# Patient Record
Sex: Female | Born: 1980 | Race: White | Hispanic: No | Smoking: Current every day smoker
Health system: Southern US, Community
[De-identification: ages and names within clinical notes are randomized; demographics above are authoritative.]

## PROBLEM LIST (undated history)

## (undated) DIAGNOSIS — Z8711 Personal history of peptic ulcer disease: Secondary | ICD-10-CM

## (undated) DIAGNOSIS — T4145XA Adverse effect of unspecified anesthetic, initial encounter: Secondary | ICD-10-CM

## (undated) DIAGNOSIS — O039 Complete or unspecified spontaneous abortion without complication: Secondary | ICD-10-CM

## (undated) DIAGNOSIS — J45909 Unspecified asthma, uncomplicated: Secondary | ICD-10-CM

## (undated) DIAGNOSIS — F1911 Other psychoactive substance abuse, in remission: Secondary | ICD-10-CM

## (undated) DIAGNOSIS — R0600 Dyspnea, unspecified: Secondary | ICD-10-CM

## (undated) DIAGNOSIS — B1920 Unspecified viral hepatitis C without hepatic coma: Secondary | ICD-10-CM

## (undated) DIAGNOSIS — M7989 Other specified soft tissue disorders: Secondary | ICD-10-CM

## (undated) DIAGNOSIS — Z9289 Personal history of other medical treatment: Secondary | ICD-10-CM

## (undated) DIAGNOSIS — D693 Immune thrombocytopenic purpura: Secondary | ICD-10-CM

## (undated) DIAGNOSIS — Z87442 Personal history of urinary calculi: Secondary | ICD-10-CM

## (undated) DIAGNOSIS — Z9189 Other specified personal risk factors, not elsewhere classified: Secondary | ICD-10-CM

## (undated) DIAGNOSIS — T148XXA Other injury of unspecified body region, initial encounter: Secondary | ICD-10-CM

## (undated) DIAGNOSIS — Z8614 Personal history of Methicillin resistant Staphylococcus aureus infection: Secondary | ICD-10-CM

## (undated) DIAGNOSIS — G43909 Migraine, unspecified, not intractable, without status migrainosus: Secondary | ICD-10-CM

## (undated) DIAGNOSIS — T8859XA Other complications of anesthesia, initial encounter: Secondary | ICD-10-CM

## (undated) DIAGNOSIS — I8390 Asymptomatic varicose veins of unspecified lower extremity: Secondary | ICD-10-CM

## (undated) DIAGNOSIS — Z8719 Personal history of other diseases of the digestive system: Secondary | ICD-10-CM

## (undated) DIAGNOSIS — J189 Pneumonia, unspecified organism: Secondary | ICD-10-CM

## (undated) DIAGNOSIS — K219 Gastro-esophageal reflux disease without esophagitis: Secondary | ICD-10-CM

## (undated) DIAGNOSIS — N200 Calculus of kidney: Secondary | ICD-10-CM

## (undated) DIAGNOSIS — N201 Calculus of ureter: Secondary | ICD-10-CM

## (undated) HISTORY — PX: DILATION AND CURETTAGE OF UTERUS: SHX78

---

## 2004-02-06 HISTORY — PX: SPLENECTOMY, TOTAL: SHX788

## 2012-05-08 HISTORY — PX: TUBAL LIGATION: SHX77

## 2013-01-13 ENCOUNTER — Encounter (HOSPITAL_COMMUNITY): Payer: Self-pay | Admitting: Emergency Medicine

## 2013-01-13 ENCOUNTER — Emergency Department (HOSPITAL_COMMUNITY)
Admission: EM | Admit: 2013-01-13 | Discharge: 2013-01-13 | Disposition: A | Discharge status: Home / Self Care | Payer: Medicaid Other | Attending: Emergency Medicine | Admitting: Emergency Medicine

## 2013-01-13 DIAGNOSIS — L0291 Cutaneous abscess, unspecified: Secondary | ICD-10-CM

## 2013-01-13 DIAGNOSIS — B1920 Unspecified viral hepatitis C without hepatic coma: Secondary | ICD-10-CM | POA: Insufficient documentation

## 2013-01-13 DIAGNOSIS — Z8619 Personal history of other infectious and parasitic diseases: Secondary | ICD-10-CM | POA: Insufficient documentation

## 2013-01-13 DIAGNOSIS — Z87891 Personal history of nicotine dependence: Secondary | ICD-10-CM | POA: Insufficient documentation

## 2013-01-13 DIAGNOSIS — L0231 Cutaneous abscess of buttock: Secondary | ICD-10-CM | POA: Insufficient documentation

## 2013-01-13 HISTORY — DX: Unspecified viral hepatitis C without hepatic coma: B19.20

## 2013-01-13 LAB — GLUCOSE, CAPILLARY: Glucose-Capillary: 102 mg/dL — ABNORMAL HIGH (ref 70–99)

## 2013-01-13 MED ORDER — SULFAMETHOXAZOLE-TRIMETHOPRIM 800-160 MG PO TABS: 1.0000 | ORAL_TABLET | Freq: Two times a day (BID) | ORAL | Status: DC

## 2013-01-13 NOTE — ED Notes (Signed)
Pt with multiple small abscess to arm and back over last 2 months with some drainage

## 2013-01-13 NOTE — ED Provider Notes (Signed)
CSN: [id number]     Arrival date & time 01/13/13  1617 History  This chart was scribed for non-physician practitioner Kaylyn Lim working with Loren Racer, MD by Leone Payor, ED Scribe. This patient was seen in room TR11C/TR11C and the patient's care was started at 1617.    Chief Complaint  Patient presents with  . Abscess   The history is provided by the patient. No language interpreter was used.   HPI Comments: Rachel Mcdonald is a 32 y.o. Female with history of abscess who presents to the Emergency Department complaining of 1-2 days of gradual onset, gradually worsening, constant abscess to the right buttock. Pt has a history of abscesses to the bilateral forearms. She was treated with antibiotics for the previous abscesses at an UC in [place]. Pt denies fevers, chills, nausea, emesis. Pt has family history of diabetes. She denies history of DM. Pt does not have any allergies to medications.   Past Medical History  Diagnosis Date  . Hepatitis C    History reviewed. No pertinent past surgical history. History reviewed. No pertinent family history. History  Substance Use Topics  . Smoking status: Former Games developer  . Smokeless tobacco: Not on file  . Alcohol Use: No   OB History   Grav Para Term Preterm Abortions TAB SAB Ect Mult Living                 Review of Systems A complete 10 system review of systems was obtained and all systems are negative except as noted in the HPI and PMH.   Allergies  Review of patient's allergies indicates no known allergies.  Home Medications  No current outpatient prescriptions on file. Triage Vitals:BP 130/80  Pulse 76  Temp(Src) 98.4 F (36.9 C) (Oral)  Resp 18  SpO2 96%  Physical Exam  Nursing note and vitals reviewed. Constitutional: She is oriented to person, place, and time. She appears well-developed and well-nourished. No distress.  HENT:  Head: Normocephalic.  Eyes: Conjunctivae and EOM are normal.  Cardiovascular:  Normal rate.   Pulmonary/Chest: Effort normal and breath sounds normal. No stridor.  Musculoskeletal: Normal range of motion.  Neurological: She is alert and oriented to person, place, and time.  Skin:  Half centimeter, indurated abscess 2 low back. No fluctuance, no drainage  Psychiatric: She has a normal mood and affect.    ED Course  Procedures   DIAGNOSTIC STUDIES: Oxygen Saturation is 96% on RA, normal by my interpretation.    COORDINATION OF CARE: 5:55 PM Will prescribe antibiotics and recommended to use warm compresses. Discussed treatment plan with pt at bedside and pt agreed to plan.  Labs Review Labs Reviewed - No data to display Imaging Review No results found.  MDM   1. Abscess    Filed Vitals:   01/13/13 1622  BP: 130/80  Pulse: 76  Temp: 98.4 F (36.9 C)  TempSrc: Oral  Resp: 18  SpO2: 96%     Rachel Mcdonald is a 32 y.o. female with recurrent abscesses over the last few months. Blood sugar within normal limits. Abscess today is small and indurated I doubt incision and drainage would yield any personal material. I will start her on antibiotics and advised her to perform warm compresses and to return to the ED if it becomes fluctuant or if she has any systemic signs of infection.  Pt is hemodynamically stable, appropriate for, and amenable to discharge at this time. Pt verbalized understanding and agrees with care plan.  All questions answered. Outpatient follow-up and specific return precautions discussed.    Discharge Medication List as of 01/13/2013  5:54 PM    START taking these medications   Details  sulfamethoxazole-trimethoprim (SEPTRA DS) 800-160 MG per tablet Take 1 tablet by mouth every 12 (twelve) hours., Starting 01/13/2013, Until Discontinued, Print        I personally performed the services described in this documentation, which was scribed in my presence. The recorded information has been reviewed and is accurate.  Note: Portions of this  report may have been transcribed using voice recognition software. Every effort was made to ensure accuracy; however, inadvertent computerized transcription errors may be present      Wynetta Emery, PA-C 01/16/13 0106

## 2013-01-16 NOTE — ED Provider Notes (Signed)
Medical screening examination/treatment/procedure(s) were performed by non-physician practitioner and as supervising physician I was immediately available for consultation/collaboration.   Achillies Buehl, MD 01/16/13 1539 

## 2013-01-30 ENCOUNTER — Encounter (HOSPITAL_COMMUNITY): Payer: Self-pay | Admitting: Emergency Medicine

## 2013-01-30 ENCOUNTER — Emergency Department (HOSPITAL_COMMUNITY)
Admission: EM | Admit: 2013-01-30 | Discharge: 2013-01-30 | Disposition: A | Discharge status: Home / Self Care | Payer: Medicaid Other | Attending: Emergency Medicine | Admitting: Emergency Medicine

## 2013-01-30 DIAGNOSIS — L988 Other specified disorders of the skin and subcutaneous tissue: Secondary | ICD-10-CM | POA: Insufficient documentation

## 2013-01-30 DIAGNOSIS — Z8619 Personal history of other infectious and parasitic diseases: Secondary | ICD-10-CM | POA: Insufficient documentation

## 2013-01-30 DIAGNOSIS — R238 Other skin changes: Secondary | ICD-10-CM

## 2013-01-30 DIAGNOSIS — Z87891 Personal history of nicotine dependence: Secondary | ICD-10-CM | POA: Insufficient documentation

## 2013-01-30 NOTE — ED Notes (Signed)
Pt c/o reddened spot under right arm, tender to touch. States she thinks it is an abscess,  Because all her other ones started out like that.

## 2013-01-30 NOTE — ED Provider Notes (Signed)
CSN: [id number]     Arrival date & time 01/30/13  4742 History   First MD Initiated Contact with Patient 01/30/13 [phone]     Chief Complaint  Patient presents with  . Abscess   (Consider location/radiation/quality/duration/timing/severity/associated sxs/prior Treatment) Patient is a 32 y.o. female presenting with abscess. The history is provided by the patient.  Abscess Location:  Shoulder/arm Shoulder/arm abscess location:  R axilla Size:  <1cm Abscess quality: painful and redness   Abscess quality: not draining, no fluctuance, no induration and no itching   Red streaking: no   Duration:  2 days Progression:  Worsening Pain details:    Quality:  Aching   Severity:  Mild   Timing:  Constant   Progression:  Worsening Chronicity:  Recurrent Context: not diabetes, not immunosuppression, not insect bite/sting and not skin injury   Associated symptoms: no fever     Past Medical History  Diagnosis Date  . Hepatitis C    History reviewed. No pertinent past surgical history. History reviewed. No pertinent family history. History  Substance Use Topics  . Smoking status: Former Games developer  . Smokeless tobacco: Not on file  . Alcohol Use: No   OB History   Grav Para Term Preterm Abortions TAB SAB Ect Mult Living                 Review of Systems  Constitutional: Negative for fever.  Respiratory: Negative for cough and shortness of breath.   All other systems reviewed and are negative.    Allergies  Review of patient's allergies indicates no known allergies.  Home Medications   Current Outpatient Rx  Name  Route  Sig  Dispense  Refill  . sulfamethoxazole-trimethoprim (SEPTRA DS) 800-160 MG per tablet   Oral   Take 1 tablet by mouth every 12 (twelve) hours.   20 tablet   0    BP 106/50  Pulse 86  Temp(Src) 98.4 F (36.9 C) (Oral)  Resp 20  SpO2 98% Physical Exam  Nursing note and vitals reviewed. Constitutional: She is oriented to person, place, and time. She  appears well-developed and well-nourished. No distress.  HENT:  Head: Normocephalic and atraumatic.  Eyes: EOM are normal. Pupils are equal, round, and reactive to light.  Neck: Normal range of motion. Neck supple.  Cardiovascular: Normal rate and regular rhythm.  Exam reveals no friction rub.   No murmur heard. Pulmonary/Chest: Effort normal and breath sounds normal. No respiratory distress. She has no wheezes. She has no rales.  Abdominal: Soft. She exhibits no distension. There is no tenderness. There is no rebound.  Musculoskeletal: Normal range of motion. She exhibits no edema.  Neurological: She is alert and oriented to person, place, and time.  Skin: She is not diaphoretic.  Small pimple in R axilla. Tender to palpation. No surrounding cellulitis or erythema.    ED Course  Procedures (including critical care time) Labs Review Labs Reviewed - No data to display Imaging Review No results found.  MDM   1. Pimples    60F with hx of recurrent abscess presents with concern for developing abscess in R axilla. Denies systemic symptoms of fever, N/V/D, abdominal pain. Hx of I&D multiple times since birth of her son 3 months ago. She is not breast feeding. AFVSS here. The concern today is a pimple/ingrown hair in her R axilla. No large abscess, no cellulitis. I offered expression of pus from the pimple, but she refused. I recommended warm compresses and attempts at drainage.  With recurrent skin abscess, I recommended dilute bleach baths to help with decolonization of her skin and gave her instructions for these in her discharge instructions. I explained to her antibiotics at this time will not help and may only hurt without any cellulitis. She wants to attempted gentle expression of the pus on her own and doesn't want an I&D here. Patient stable for discharge, given the resource guide for f/u as she just moved here. Gave her strict return precautions in the event this worsens.    Dagmar Hait, MD 01/30/13 [phone]

## 2013-03-05 ENCOUNTER — Emergency Department (HOSPITAL_COMMUNITY)
Admission: EM | Admit: 2013-03-05 | Discharge: 2013-03-05 | Disposition: A | Discharge status: Home / Self Care | Payer: Medicaid Other | Attending: Emergency Medicine | Admitting: Emergency Medicine

## 2013-03-05 ENCOUNTER — Encounter (HOSPITAL_COMMUNITY): Payer: Self-pay | Admitting: Emergency Medicine

## 2013-03-05 DIAGNOSIS — L089 Local infection of the skin and subcutaneous tissue, unspecified: Secondary | ICD-10-CM | POA: Insufficient documentation

## 2013-03-05 DIAGNOSIS — Z87891 Personal history of nicotine dependence: Secondary | ICD-10-CM | POA: Insufficient documentation

## 2013-03-05 DIAGNOSIS — Z8619 Personal history of other infectious and parasitic diseases: Secondary | ICD-10-CM | POA: Insufficient documentation

## 2013-03-05 MED ORDER — CLINDAMYCIN HCL 300 MG PO CAPS
300.0000 mg | ORAL_CAPSULE | Freq: Once | ORAL | Status: AC
  Administered 2013-03-05: 300 mg via ORAL
  Filled 2013-03-05: qty 1

## 2013-03-05 MED ORDER — CLINDAMYCIN HCL 150 MG PO CAPS: 300.0000 mg | ORAL_CAPSULE | Freq: Three times a day (TID) | ORAL | Status: DC

## 2013-03-05 NOTE — ED Notes (Signed)
Pt complains of recurrent skin infections, has been told that its MRSA

## 2013-03-05 NOTE — ED Provider Notes (Signed)
CSN: [id number]     Arrival date & time 03/05/13  2111 History   First MD Initiated Contact with Patient 03/05/13 2143     Chief Complaint  Patient presents with  . Recurrent Skin Infections   (Consider location/radiation/quality/duration/timing/severity/associated sxs/prior Treatment) HPI Comments: Patient is a 32 year old female with a past medical history of hepatitis C who presents complaining of recurrent skin infections. Patient reports symptoms started about 3 months ago gradually and progressively worsened. Patient reports initially having a localized infection of her forearm. She was told it was a spider bite and noticed it had spread to the other arm. Patient was treated with antibiotics. Patient had the infection spread to her face 1 month ago and was prescribed Bactrim in the ED. Patient reports some relief initially but now worsening of symptoms. The affected areas are painful and tender to palpation. Patient denies anyone around her with the same symptoms.    Past Medical History  Diagnosis Date  . Hepatitis C    History reviewed. No pertinent past surgical history. History reviewed. No pertinent family history. History  Substance Use Topics  . Smoking status: Former Games developer  . Smokeless tobacco: Not on file  . Alcohol Use: No   OB History   Grav Para Term Preterm Abortions TAB SAB Ect Mult Living                 Review of Systems  Skin: Positive for wound.  All other systems reviewed and are negative.    Allergies  Review of patient's allergies indicates no known allergies.  Home Medications   Current Outpatient Rx  Name  Route  Sig  Dispense  Refill  . Aspirin-Acetaminophen-Caffeine (GOODYS EXTRA STRENGTH) 260-130-16 MG TABS   Oral   Take 1 tablet by mouth 2 (two) times daily as needed (pain).         Marland Kitchen ibuprofen (ADVIL,MOTRIN) 200 MG tablet   Oral   Take 800 mg by mouth every 6 (six) hours as needed for pain (pain).          BP 118/80  Pulse 87   Temp(Src) 98.2 F (36.8 C) (Oral)  Resp 18  Ht 5\' 4"  (1.626 m)  Wt 275 lb (124.739 kg)  BMI 47.18 kg/m2  LMP 02/12/2013 Physical Exam  Nursing note and vitals reviewed. Constitutional: She is oriented to person, place, and time. She appears well-developed and well-nourished. No distress.  HENT:  Head: Normocephalic and atraumatic.  See skin.  Eyes: Conjunctivae are normal.  Neck: Normal range of motion.  Cardiovascular: Normal rate and regular rhythm.  Exam reveals no gallop and no friction rub.   No murmur heard. Pulmonary/Chest: Effort normal and breath sounds normal. She has no wheezes. She has no rales. She exhibits no tenderness.  Musculoskeletal: Normal range of motion.  Neurological: She is alert and oriented to person, place, and time. Coordination normal.  Speech is goal-oriented. Moves limbs without ataxia.   Skin: Skin is warm and dry.  Pea to dime sized areas of induration located on patient's face. No drainage noted. The areas are tender to palpate.   Psychiatric: She has a normal mood and affect. Her behavior is normal.    ED Course  Procedures (including critical care time) Labs Review Labs Reviewed - No data to display Imaging Review No results found.  EKG Interpretation   None       MDM   1. Skin infection     9:57 PM Patient will  have Clindamycin and instructions to follow up with a Dermatologist. Vitals stable and patient afebrile.    Emilia Beck, PA-C 03/05/13 2228

## 2013-03-05 NOTE — ED Provider Notes (Signed)
Medical screening examination/treatment/procedure(s) were performed by non-physician practitioner and as supervising physician I was immediately available for consultation/collaboration.  EKG Interpretation   None         Richardean Canal, MD 03/05/13 2258

## 2013-12-01 ENCOUNTER — Emergency Department (HOSPITAL_COMMUNITY)
Admission: EM | Admit: 2013-12-01 | Discharge: 2013-12-01 | Discharge status: Against Medical Advice | Payer: Medicaid Other

## 2014-04-28 ENCOUNTER — Emergency Department (HOSPITAL_COMMUNITY): Payer: Medicaid Other

## 2014-04-28 ENCOUNTER — Emergency Department (HOSPITAL_COMMUNITY)
Admission: EM | Admit: 2014-04-28 | Discharge: 2014-04-28 | Disposition: A | Discharge status: Home / Self Care | Payer: Medicaid Other | Attending: Emergency Medicine | Admitting: Emergency Medicine

## 2014-04-28 ENCOUNTER — Encounter (HOSPITAL_COMMUNITY): Payer: Self-pay | Admitting: Emergency Medicine

## 2014-04-28 DIAGNOSIS — J45901 Unspecified asthma with (acute) exacerbation: Secondary | ICD-10-CM | POA: Insufficient documentation

## 2014-04-28 DIAGNOSIS — J40 Bronchitis, not specified as acute or chronic: Secondary | ICD-10-CM

## 2014-04-28 DIAGNOSIS — Z720 Tobacco use: Secondary | ICD-10-CM | POA: Insufficient documentation

## 2014-04-28 DIAGNOSIS — F17200 Nicotine dependence, unspecified, uncomplicated: Secondary | ICD-10-CM

## 2014-04-28 DIAGNOSIS — Z8619 Personal history of other infectious and parasitic diseases: Secondary | ICD-10-CM | POA: Insufficient documentation

## 2014-04-28 DIAGNOSIS — Z792 Long term (current) use of antibiotics: Secondary | ICD-10-CM | POA: Insufficient documentation

## 2014-04-28 DIAGNOSIS — Z8709 Personal history of other diseases of the respiratory system: Secondary | ICD-10-CM

## 2014-04-28 MED ORDER — PREDNISONE 20 MG PO TABS
60.0000 mg | ORAL_TABLET | Freq: Once | ORAL | Status: AC
  Administered 2014-04-28: 60 mg via ORAL
  Filled 2014-04-28: qty 3

## 2014-04-28 MED ORDER — PREDNISONE 20 MG PO TABS: ORAL_TABLET | ORAL | Status: DC

## 2014-04-28 MED ORDER — ALBUTEROL SULFATE HFA 108 (90 BASE) MCG/ACT IN AERS
2.0000 | INHALATION_SPRAY | RESPIRATORY_TRACT | Status: DC | PRN
  Administered 2014-04-28: 2 via RESPIRATORY_TRACT
  Filled 2014-04-28: qty 6.7

## 2014-04-28 MED ORDER — AZITHROMYCIN 250 MG PO TABS: 250.0000 mg | ORAL_TABLET | Freq: Every day | ORAL | Status: DC

## 2014-04-28 MED ORDER — IPRATROPIUM-ALBUTEROL 0.5-2.5 (3) MG/3ML IN SOLN
3.0000 mL | Freq: Once | RESPIRATORY_TRACT | Status: AC
Start: 2014-04-28 — End: 2014-04-28
  Administered 2014-04-28: 3 mL via RESPIRATORY_TRACT
  Filled 2014-04-28: qty 3

## 2014-04-28 NOTE — ED Notes (Signed)
Pt reports she began to feel unwell Sunday afternoon and progressively began to have nasal drainage, sore throat and cough. Pt reports productive cough with green sputum.

## 2014-04-28 NOTE — ED Provider Notes (Signed)
CSN: [id number]     Arrival date & time 04/28/14  0920 History   First MD Initiated Contact with Patient 04/28/14 [phone]     Chief Complaint  Patient presents with  . Cough     (Consider location/radiation/quality/duration/timing/severity/associated sxs/prior Treatment) HPI Pt is a 33yo female with hx of asthma, presenting to ED with c/o gradually worsening productive cough that started Sunday, 04/26/14, associated with generalized fatigue, nasal drainage, sore throat and cough.  Reports cough produces thick green sputum. Pt does report being a current cigarette smoker after having stopped for 9months.  She has tried OTC Mucinex and Zicam with minimal relief. States her infant son was sick last week with GI symptoms but denies fever, chills, n/v/d. Denies chest pain. Denies recent travel.    Past Medical History  Diagnosis Date  . Hepatitis C    History reviewed. No pertinent past surgical history. History reviewed. No pertinent family history. History  Substance Use Topics  . Smoking status: Former Games developermoker  . Smokeless tobacco: Not on file  . Alcohol Use: No   OB History    No data available     Review of Systems  Constitutional: Positive for fatigue. Negative for fever, chills and appetite change.  HENT: Positive for congestion, rhinorrhea and sore throat. Negative for trouble swallowing and voice change.   Respiratory: Positive for cough and shortness of breath.   Cardiovascular: Negative for chest pain and palpitations.  Gastrointestinal: Negative for nausea, vomiting, abdominal pain and diarrhea.  All other systems reviewed and are negative.     Allergies  Review of patient's allergies indicates no known allergies.  Home Medications   Prior to Admission medications   Medication Sig Start Date End Date Taking? Authorizing Provider  Aspirin-Acetaminophen-Caffeine (GOODYS EXTRA STRENGTH) 260-130-16 MG TABS Take 1 tablet by mouth 2 (two) times daily as needed (pain).    Yes Historical Provider, MD  dextromethorphan-guaiFENesin (MUCINEX DM) 30-600 MG per 12 hr tablet Take 1 tablet by mouth 2 (two) times daily as needed for cough.   Yes Historical Provider, MD  Homeopathic Products [facility](ZICAM COLD REMEDY) TBDP Take 1 tablet by mouth 3 (three) times daily as needed (for cold).   Yes Historical Provider, MD  azithromycin (ZITHROMAX) 250 MG tablet Take 1 tablet (250 mg total) by mouth daily. Take first 2 tablets together, then 1 every day until finished. 04/28/14   Junius FinnerErin O'Malley, PA-C  clindamycin (CLEOCIN) 150 MG capsule Take 2 capsules (300 mg total) by mouth 3 (three) times daily. May dispense as 150mg  capsules Patient not taking: Reported on 04/28/2014 03/05/13   Emilia BeckKaitlyn Szekalski, PA-C  predniSONE (DELTASONE) 20 MG tablet 3 tabs po day one, then 2 po daily x 4 days 04/28/14   Junius FinnerErin O'Malley, PA-C   BP 118/81 mmHg  Pulse 87  Temp(Src) 98.6 F (37 C) (Oral)  Resp 16  SpO2 98%  LMP 04/14/2014 Physical Exam  Constitutional: She appears well-developed and well-nourished. No distress.  HENT:  Head: Normocephalic and atraumatic.  Right Ear: Hearing, tympanic membrane, external ear and ear canal normal.  Left Ear: Hearing, tympanic membrane, external ear and ear canal normal.  Nose: Mucosal edema and rhinorrhea present. Right sinus exhibits no maxillary sinus tenderness and no frontal sinus tenderness. Left sinus exhibits no maxillary sinus tenderness and no frontal sinus tenderness.  Mouth/Throat: Uvula is midline, oropharynx is clear and moist and mucous membranes are normal.  Eyes: Conjunctivae are normal. No scleral icterus.  Neck: Normal range of motion.  Cardiovascular:  Normal rate, regular rhythm and normal heart sounds.   Pulmonary/Chest: Effort normal. No respiratory distress. She has decreased breath sounds in the right lower field and the left lower field. She has wheezes ( inspiratory and expiratory throughout) in the right upper field, the right middle  field, the right lower field, the left upper field, the left middle field and the left lower field. She has no rales. She exhibits no tenderness.  Abdominal: Soft. Bowel sounds are normal. She exhibits no distension and no mass. There is no tenderness. There is no rebound and no guarding.  Musculoskeletal: Normal range of motion.  Neurological: She is alert.  Skin: Skin is warm and dry. She is not diaphoretic.  Nursing note and vitals reviewed.   ED Course  Procedures (including critical care time) Labs Review Labs Reviewed - No data to display  Imaging Review Dg Chest 2 View  04/28/2014   CLINICAL DATA:  Cough and difficulty breathing ; 2 day history of midportion chest discomfort  EXAM: CHEST  2 VIEW  COMPARISON:  None.  FINDINGS: There is no edema or consolidation. Heart is upper normal in size with pulmonary vascularity within normal limits. No adenopathy. No pneumothorax. 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 Bony structures appear intact.  IMPRESSION: Pectus carinatum.  No edema or consolidation.   Electronically Signed   By: Bretta BangWilliam  Woodruff M.D.   On: 04/28/2014 10:05     EKG Interpretation None      MDM   Final diagnoses:  Bronchitis  History of asthma  Current smoker    Pt is a current smoker, c/o cough and congestion that started Sunday 12/20 associated with sore throat, rhinorrhea and fatigue. Denies fever, n/v/d. Denies chest pain.  Doubt ACS or PE.  Concern for CAP.  On exam, Lungs: diffuse inspiratory and expiratory wheeze. Pt given PO prednisone and duoneb. CXR: pectus carinatum. No edema or consolidation.   After duoneb tx, pt states she feels moderately better.  Lung sounds improved, faint expiratory wheeze in lower lung field remains. Will tx pt with azithromycin due to her productive cough, wheeze, hx of smoking and asthma.  Albuterol inahler provided in ED. Rx: azithromycin, prednisone. Pt also requested help for smoking cessation. Discussed resources with pt as well as  provided pt education tips on smoking cessation. Home care instructions provided. Return precautions provided. Pt verbalized understanding and agreement with tx plan.    Junius Finnerrin O'Malley, PA-C 04/28/14 1144  Kristen N Ward, DO 04/28/14 1529

## 2014-05-22 ENCOUNTER — Emergency Department (HOSPITAL_COMMUNITY)
Admission: EM | Admit: 2014-05-22 | Discharge: 2014-05-22 | Disposition: A | Discharge status: Home / Self Care | Payer: Medicaid Other | Attending: Emergency Medicine | Admitting: Emergency Medicine

## 2014-05-22 ENCOUNTER — Encounter (HOSPITAL_COMMUNITY): Payer: Self-pay | Admitting: Emergency Medicine

## 2014-05-22 DIAGNOSIS — Z79899 Other long term (current) drug therapy: Secondary | ICD-10-CM | POA: Insufficient documentation

## 2014-05-22 DIAGNOSIS — Z7952 Long term (current) use of systemic steroids: Secondary | ICD-10-CM | POA: Insufficient documentation

## 2014-05-22 DIAGNOSIS — Z792 Long term (current) use of antibiotics: Secondary | ICD-10-CM | POA: Insufficient documentation

## 2014-05-22 DIAGNOSIS — Z720 Tobacco use: Secondary | ICD-10-CM | POA: Insufficient documentation

## 2014-05-22 DIAGNOSIS — R519 Headache, unspecified: Secondary | ICD-10-CM

## 2014-05-22 DIAGNOSIS — R51 Headache: Secondary | ICD-10-CM

## 2014-05-22 DIAGNOSIS — G43909 Migraine, unspecified, not intractable, without status migrainosus: Secondary | ICD-10-CM | POA: Insufficient documentation

## 2014-05-22 DIAGNOSIS — Z8619 Personal history of other infectious and parasitic diseases: Secondary | ICD-10-CM | POA: Insufficient documentation

## 2014-05-22 DIAGNOSIS — E669 Obesity, unspecified: Secondary | ICD-10-CM | POA: Insufficient documentation

## 2014-05-22 HISTORY — DX: Migraine, unspecified, not intractable, without status migrainosus: G43.909

## 2014-05-22 MED ORDER — METOCLOPRAMIDE HCL 5 MG/ML IJ SOLN
10.0000 mg | Freq: Once | INTRAMUSCULAR | Status: AC
  Administered 2014-05-22: 10 mg via INTRAMUSCULAR
  Filled 2014-05-22: qty 2

## 2014-05-22 MED ORDER — DIPHENHYDRAMINE HCL 50 MG/ML IJ SOLN
25.0000 mg | Freq: Once | INTRAMUSCULAR | Status: AC
  Administered 2014-05-22: 25 mg via INTRAMUSCULAR
  Filled 2014-05-22: qty 1

## 2014-05-22 MED ORDER — DEXAMETHASONE SODIUM PHOSPHATE 10 MG/ML IJ SOLN
10.0000 mg | Freq: Once | INTRAMUSCULAR | Status: AC
  Administered 2014-05-22: 10 mg via INTRAMUSCULAR
  Filled 2014-05-22: qty 1

## 2014-05-22 NOTE — Discharge Instructions (Signed)
It is important for you to follow up with [facility] neurolog[facility] for further evaluation and management of your headaches. Return to ED for worsening symptoms.   Emergency Department Resource Guide 1) Find a Doctor and Pay Out of Pocket Although you won't have to find out who is covered by your insurance plan, it is a good idea to ask around and get recommendations. You will then need to call the office and see if the doctor you have chosen will accept you as a new patient and what types of options they offer for patients who are self-pay. Some doctors offer discounts or will set up payment plans for their patients who do not have insurance, but you will need to ask so you aren't surprised when you get to your appointment.  2) Contact Your Local Health Department Not all health departments have doctors that can see patients for sick visits, but many do, so it is worth a call to see if yours does. If you don't know where your local health department is, you can check in your phone book. The CDC also has a tool to help you locate your state's health department, and many state websites also have listings of all of their local health departments.  3) Find a Walk-in Clinic If your illness is not likely to be very severe or complicated, you may want to try a walk in clinic. These are popping up all over the country in pharmacies, drugstores, and shopping centers. They're usually staffed by nurse practitioners or physician assistants that have been trained to treat common illnesses and complaints. They're usually fairly quick and inexpensive. However, if you have serious medical issues or chronic medical problems, these are probably not your best option.  No Primary Care Doctor: - Call Health Connect at  [phone] - they can help you locate a primary care doctor that  accepts your insurance, provides certain services, etc. - Physician Referral Service- [phone]  Chronic Pain Problems: Organization          Address  Phone   Notes  [facility]  [phone] Patients need to be referred by their primary care doctor.   Medication Assistance: Organization         Address  Phone   Notes  [facility] [address] [place], [place] [address] [phone] --Must be a resident of [place] -- Must have NO insurance coverage whatsoever (no Medicaid/ Medicare, etc.) -- The pt. MUST have a primary care doctor that directs their care regularly and follows them in the community   MedAssist  [phone]   [employer]  [phone]    Agencies that provide inexpensive medical care: Organization         Address  Phone   Notes  [facility]  [phone]   [facility]    [phone]   [facility] [address] [place], [place] [address] [phone]   [facility] [address], [place] [phone]   [facility]    [phone]   [facility]    [phone]   [facility]  [address], [place] Phone:  [phone], Fax:  [phone] Hours of Operation:  9 am - 6 pm, M-F.  Also accepts Medicaid/Medicare and self-pay.  [facility]  [address], [place]  Phone: [phone], Fax: [phone]. Hours of Operation:  8:30 am - 5:30 pm, M-F.  Also accepts Medicaid and self-pay.  °[facility] [place] [address], [place] Phone: [phone]) [phone]   °[facility] [address], [place], [place] [phone], Ext. 123 Mondays & Thursdays: 7-9 AM.  First 15 patients are seen on a first come, first serve basis. °  ° °Medicaid-accepting [place] Providers: ° °Organization         Address  Phone   Notes  °[facility] [address], [place] [phone] Also accepts self-pay patients.  °[facility] [address], [place] ° ([phone]) [phone]   °[facility] [address], [place] [phone]) [phone]   °[facility] [address] [place] [address], [place] ([phone]) [phone]   °Veita Bland [address], [place]  ° [phone]) [phone] Only accepts [place] Access Medicaid patients after they have their name applied to their card.  ° °Self-Pay (no insurance) in [place]: ° °Organization         Address  Phone   Notes  °Sickle Cell Patients, [facility] [address], [place] [phone]) [phone]   °[facility] [address], [place] ([phone]) [phone]   °[facility] ° [address], [place] [phone]) [phone]   °[facility]/Dr. Osei-Bonsu ° [address] [place] [address], [place] or [address], [place] [phone]) [phone] Phone number for both [place] and [place] locations is the same.  °[facility] [address], [place] ([phone]) [phone]   °[facility] [address] [place] [address], [place] or [address] [phone]) [phone] °(336) [phone]   °[facility] [address], [place] ([phone]) [phone], phone; [phone], fax Sees patients 1st and 3rd Saturday of every month.  Must not qualify for public or private insurance (i.e. Medicaid, Medicare, [place] Health Choice, Veterans' Benefits) • Household income should be no more than 200% of the poverty level •The clinic cannot treat you if you are pregnant or think you are pregnant • Sexually transmitted diseases are not treated at the clinic.  ° ° °Dental Care: °Organization         Address  Phone  Notes  °[facility] [facility] [address], [place] ([phone]) [phone] Accepts children up to age 21 who are enrolled in Medicaid or [place] Health Choice; pregnant women with a Medicaid card; and  children who have applied for Medicaid or [place] Health Choice, but were declined, whose parents can pay a reduced fee at time of service.  °[place] [facility] [place]  [address], [place] (336) [phone] Accepts children up to age 21 who are enrolled in Medicaid or [place] Health Choice; pregnant women with a Medicaid card; and children who have applied for Medicaid or  Health Choice, but were declined, whose parents can pay a reduced fee at time of service.  °[facility] ° [address],  ([phone]) [phone] Patients are seen by appointment only. Walk-ins are not accepted. [facility] will see patients 18 years of age and older. °Monday - Tuesday (8am-5pm) °Most Wednesdays (8:30-5pm) °$30 per visit, cash only  °[facility] ° [address], [facility] [phone] Patients are seen by appointment only. Walk-ins are not accepted. [place] will see patients 23 years of age and older. One Wednesday Evening (Monthly: Volunteer Based).  $30 per visit, cash only  [place]  [phone] for adults; Children under age 68, call Graduate Pediatric Dentistry at [phone]. Children aged 23-14, please call [phone] to request a pediatric application.  Dental services are provided in all areas of dental care including fillings, crowns and bridges, complete and partial dentures, implants, gum treatment, root canals, and extractions. Preventive care is also provided. Treatment is provided to both adults and children. Patients are selected via a lottery and there is often a waiting list.   [facility] [address] [place]  [phone] [email or web address]   [facility] [address] [place], [place] [phone], Ext. [phone] Second and Fourth Thursday of each month, opens at 6:30 AM; Clinic ends at 9 AM.  Patients are seen on a first-come first-served  basis, and a limited number are seen during each clinic.   [facility]  [address] [place]ard Danker [place], [place] [phone]   Eligibility Requirements You must have lived in [place], [place], or [place] counties for at least the last three months.   You cannot be eligible for state or federal sponsored [employer], including [employer], [place], or [employer].   You generally cannot be eligible for healthcare insurance through your employer.    How to apply: Eligibility screenings are held every Tuesday and Wednesday afternoon from 1:00 pm until 4:00 pm. You do not need an appointment for the interview!  [facility] [address], [place], [place]   [place]  [place]  [phone]    Behavioral Health Resources in the Community: Intensive Outpatient Programs Organization         Address  Phone  Notes  [facility] [place] [address], [place], [place] [phone]   [facility] [address] [place], [place]   [facility] [address] [place], [place]   [place] [address]  [place], [place] or [phone]   Substance Abuse Resources Organization         Address  Phone  Notes  [facility]  [phone]) [phone]   [place]  ([phone]) [phone]   [facility] [place]   Chinita Pester  [phone]   Residential & Outpatient Substance Abuse Program  [phone]   Psychological Services Organization         Address  Phone  Notes  [facility] [place]  [place]  ([phone]) [phone]   [place] [address], [place] or [phone]    Mobile Crisis Teams Organization          Address  Phone  Notes  [facility], Mobile Crisis Care Unit  [phone]   [facility]  [address] [place], [place]   [place] [address] [place] [phone]    Self-Help/Support Groups Organization         Address  Phone             Notes  [place] [facility] [place] - variety of  support groups  [phone]- [phone] Call for more information  °Narcotics Anonymous (NA), [facility] [address], °[place]  2 meetings at this location  ° °Residential Treatment Programs °Organization         Address  Phone  Notes  °[facility] [address],    °[place]  [phone]   °[facility] ° [address], [place], [place] [phone]   °[facility] [address], [place] [phone] Admissions: 8am-3pm M-F  °[facility] [address]    °[place], [place] [phone]   °[facility] [address], [place], [place] [phone]   °[facility] [address]  °[place], [place] [phone]   °[facility] - Intensive Outpatient [address], [place], [place] [phone]   °[facility] [address]  °[place], [place] 1-877-615-2722 or [phone]   °[facility] ([facility]) [address] [place], [place] [phone] Accepts Medicaid  °[facility] [address]  °[place] 1-800-659-3381 Substance Abuse/Addiction Treatment  ° °[place] Behavioral Health Resources °Organization         Address  Phone  Notes  °[facility]  (888) [phone]   °Julie Brannon, PhD [address] [place], [place]   (336) [phone] or (336) 951-0000   °[facility]   [address] °[place], [place] (336) [phone]   °[facility] [address], [place], [place] (336) [phone] Insurance/Medicaid/sponsorship  through [facility]  °[facility] [address]                                    [place], [place] (336) [phone] Therapy/tele-psych/case  °[facility] [address]  ° [place], [place] (336) [phone]    °Dr. Arfeen  (336) [phone]   °[facility] [place]  [employer] [place] [facility] 1) [address], [place] °2) [address], [place] °3)  [address], [place] (336) [phone] °(336) [phone] ° °(336) [phone]   °[place] Child Abuse Hotline (336) 342-1394 or (336) [phone] (After Hours)    ° ° ° °

## 2014-05-22 NOTE — ED Provider Notes (Signed)
CSN: [id number]     Arrival date & time 05/22/14  0522 History   None    Chief Complaint  Patient presents with  . Migraine     (Consider location/radiation/quality/duration/timing/severity/associated sxs/prior Treatment) HPI Rachel Mcdonald is a 34 y.o. female reports a history of migraines who comes in for evaluation of acute migraine. Patient states on Wednesday at approximately 11:00 she experienced a rapid onset of her headache over approximately 15 minutes. Attributes this HA to the stress of starting college that day. She characterizes this headache as a throbbing and stabbing sensation that moves across her head from behind her eyes to the top of her head to behind her head. She reports experiencing migraines like this in the past. She is tried "a sixpack of Goody's powders" this seemed to resolve the headache, but then this morning at 5:00 the headache returned. She reports taking an Excedrin this morning and now reports the headache has improved to a 4/10. She reports associated photophobia and nausea. Denies fevers, neck stiffness, vomiting, numbness or weakness. No chest pain, shortness of breath, abdominal pain, nausea or vomiting, diarrhea or constipation.  Past Medical History  Diagnosis Date  . Hepatitis C   . Migraines    Past Surgical History  Procedure Laterality Date  . Splenectomy, total    . Chest tube placement    . C sections    . Dilation and curettage of uterus     Family History  Problem Relation Age of Onset  . Diabetes Other    History  Substance Use Topics  . Smoking status: Current Every Day Smoker    Types: Cigarettes  . Smokeless tobacco: Not on file  . Alcohol Use: No   OB History    No data available     Review of Systems  All other systems reviewed and are negative.  A 10 point review of systems was completed and was negative except for pertinent positives and negatives as mentioned in the history of present illness     Allergies   Review of patient's allergies indicates no known allergies.  Home Medications   Prior to Admission medications   Medication Sig Start Date End Date Taking? Authorizing Provider  acetaminophen (TYLENOL) 500 MG tablet Take 500 mg by mouth every 6 (six) hours as needed for headache.   Yes Historical Provider, MD  Aspirin-Acetaminophen-Caffeine (GOODY HEADACHE PO) Take 1 packet by mouth every 6 (six) hours as needed (for pain).   Yes Historical Provider, MD  ibuprofen (ADVIL,MOTRIN) 200 MG tablet Take 600-800 mg by mouth every 6 (six) hours as needed for moderate pain.   Yes Historical Provider, MD  azithromycin (ZITHROMAX) 250 MG tablet Take 1 tablet (250 mg total) by mouth daily. Take first 2 tablets together, then 1 every day until finished. Patient not taking: Reported on 05/22/2014 04/28/14   Junius Finner, PA-C  clindamycin (CLEOCIN) 150 MG capsule Take 2 capsules (300 mg total) by mouth 3 (three) times daily. May dispense as  capsules Patient not taking: Reported on 04/28/2014 03/05/13   Emilia Beck, PA-C  predniSONE (DELTASONE) 20 MG tablet 3 tabs po day one, then 2 po daily x 4 days Patient not taking: Reported on 05/22/2014 04/28/14   Junius Finner, PA-C   BP 125/74 mmHg  Pulse 85  Temp(Src) 97.7 F (36.5 C) (Oral)  Resp 18  SpO2 100%  LMP 05/11/2014 (Approximate) Physical Exam  Constitutional: She is oriented to person, place, and time. She appears well-developed and  well-nourished.  Obese  HENT:  Head: Normocephalic and atraumatic.  Mouth/Throat: Oropharynx is clear and moist.  Eyes: Conjunctivae and EOM are normal. Pupils are equal, round, and reactive to light. Right eye exhibits no discharge. Left eye exhibits no discharge. No scleral icterus.  Neck: Normal range of motion. Neck supple.  No meningismus  Cardiovascular: Normal rate, regular rhythm and normal heart sounds.   Pulmonary/Chest: Effort normal and breath sounds normal. No respiratory distress. She has  no wheezes. She has no rales.  Abdominal: Soft. There is no tenderness.  Musculoskeletal: Normal range of motion. She exhibits no tenderness.  Neurological: She is alert and oriented to person, place, and time.  Cranial Nerves II-XII grossly intact. Motor and sensation 5/5 in all 4 extremities. Gait baseline without ataxia.  Skin: Skin is warm and dry. No rash noted.  Psychiatric: She has a normal mood and affect.  Nursing note and vitals reviewed.   ED Course  Procedures (including critical care time) Labs Review Labs Reviewed - No data to display  Imaging Review No results found.   EKG Interpretation None     Meds given in ED:  Medications  dexamethasone (DECADRON) injection 10 mg (10 mg Intramuscular Given 05/22/14 0701)  diphenhydrAMINE (BENADRYL) injection 25 mg (25 mg Intramuscular Given 05/22/14 0657)  metoCLOPramide (REGLAN) injection 10 mg (10 mg Intramuscular Given 05/22/14 0659)    New Prescriptions   No medications on file   Filed Vitals:   05/22/14 0531 05/22/14 0642  BP: 123/76 125/74  Pulse: 87 85  Temp: 97.7 F (36.5 C)   TempSrc: Oral   Resp: 16 18  SpO2: 99% 100%    MDM  Vitals stable - WNL -afebrile Pt resting comfortably in ED. Headache resolved in ED PE--normal neuro exam. No concern for other acute or emergent pathology  DDX--headache today similar to previous headaches. Headache had completely resolved prior to arrival, had returned and has been rapidly improving in the ED. Low concern for [facility] or other ICH, meningitis, toxin exposure. Will DC with referral to neurology and encourage continued use of OTC headache relievers, as this has worked well for her. I discussed all relevant lab findings and imaging results with pt and they verbalized understanding. Discussed f/u with PCP within 48 hrs and return precautions, pt very amenable to plan.  Prior to patient discharge, I discussed and reviewed this case with Dr.Otter    Final diagnoses:   Acute nonintractable headache, unspecified headache type        Sharlene MottsBenjamin W Paytan Recine, PA-C 05/22/14 [id number]  Olivia Mackielga M Otter, MD 05/22/14 [phone]

## 2014-05-22 NOTE — ED Notes (Signed)
Pt states she has had a migraine headache since Wednesday  Pt states the pain comes and goes  Pt states she has taken OTC meds without relief  Pt states she has had nausea and vomiting with the headache

## 2014-08-31 ENCOUNTER — Emergency Department (HOSPITAL_COMMUNITY)
Admission: EM | Admit: 2014-08-31 | Discharge: 2014-08-31 | Disposition: A | Discharge status: Home / Self Care | Payer: Medicaid Other | Attending: Emergency Medicine | Admitting: Emergency Medicine

## 2014-08-31 ENCOUNTER — Encounter (HOSPITAL_COMMUNITY): Payer: Self-pay

## 2014-08-31 DIAGNOSIS — Z720 Tobacco use: Secondary | ICD-10-CM | POA: Insufficient documentation

## 2014-08-31 DIAGNOSIS — Z7952 Long term (current) use of systemic steroids: Secondary | ICD-10-CM | POA: Insufficient documentation

## 2014-08-31 DIAGNOSIS — Z792 Long term (current) use of antibiotics: Secondary | ICD-10-CM | POA: Insufficient documentation

## 2014-08-31 DIAGNOSIS — Z8679 Personal history of other diseases of the circulatory system: Secondary | ICD-10-CM | POA: Insufficient documentation

## 2014-08-31 DIAGNOSIS — Z8619 Personal history of other infectious and parasitic diseases: Secondary | ICD-10-CM | POA: Insufficient documentation

## 2014-08-31 DIAGNOSIS — L709 Acne, unspecified: Secondary | ICD-10-CM | POA: Insufficient documentation

## 2014-08-31 MED ORDER — CHLORHEXIDINE GLUCONATE 4 % EX LIQD: Freq: Every day | CUTANEOUS | Status: DC | PRN

## 2014-08-31 MED ORDER — BENZOYL PEROXIDE 5 % EX LIQD: Freq: Two times a day (BID) | CUTANEOUS | Status: DC

## 2014-08-31 NOTE — ED Provider Notes (Signed)
CSN: [id number]     Arrival date & time 08/31/14  0809 History   First MD Initiated Contact with Patient 08/31/14 [phone]     Chief Complaint  Patient presents with  . Rash     (Consider location/radiation/quality/duration/timing/severity/associated sxs/prior Treatment) HPI   Patient with hx Hep C, cystic acne p/w multiple lesions throughout back, bilateral buttocks, upper thighs that have been ongoing for months.  States she uses mild soap and scrubs the lesions with a towel.  Has squeezed some of them and have had blood and clear fluid come out.  Has taken keflex she has at home without improvement.  Denies fever, chills, myalgias.  Denies purulent discharge.  Is unable to afford to go to dermatologist.  Denies changes in personal care products including detergents, soaps, shampoos, lotions, perfumes. Denies new clothing or furniture.  Denies travel, visiting other people's houses.  Denies any recent camping or time spent in the woods.  Denies known tick bites.  Denies chemical or plant exposures.    Past Medical History  Diagnosis Date  . Hepatitis C   . Migraines    Past Surgical History  Procedure Laterality Date  . Splenectomy, total    . Chest tube placement    . C sections    . Dilation and curettage of uterus    . Tubal ligation     Family History  Problem Relation Age of Onset  . Diabetes Other    History  Substance Use Topics  . Smoking status: Current Every Day Smoker -- 1.00 packs/day    Types: Cigarettes  . Smokeless tobacco: Not on file  . Alcohol Use: No   OB History    No data available     Review of Systems  All other systems reviewed and are negative.     Allergies  Review of patient's allergies indicates no known allergies.  Home Medications   Prior to Admission medications   Medication Sig Start Date End Date Taking? Authorizing Provider  acetaminophen (TYLENOL) 500 MG tablet Take 500 mg by mouth every 6 (six) hours as needed for headache.     Historical Provider, MD  Aspirin-Acetaminophen-Caffeine (GOODY HEADACHE PO) Take 1 packet by mouth every 6 (six) hours as needed (for pain).    Historical Provider, MD  azithromycin (ZITHROMAX) 250 MG tablet Take 1 tablet (250 mg total) by mouth daily. Take first 2 tablets together, then 1 every day until finished. Patient not taking: Reported on 05/22/2014 04/28/14   Junius FinnerErin O'Malley, PA-C  clindamycin (CLEOCIN) 150 MG capsule Take 2 capsules (300 mg total) by mouth 3 (three) times daily. May dispense as 150mg  capsules Patient not taking: Reported on 04/28/2014 03/05/13   Emilia BeckKaitlyn Szekalski, PA-C  ibuprofen (ADVIL,MOTRIN) 200 MG tablet Take 600-800 mg by mouth every 6 (six) hours as needed for moderate pain.    Historical Provider, MD  predniSONE (DELTASONE) 20 MG tablet 3 tabs po day one, then 2 po daily x 4 days Patient not taking: Reported on 05/22/2014 04/28/14   Junius FinnerErin O'Malley, PA-C   BP 143/98 mmHg  Pulse 84  Temp(Src) 97.5 F (36.4 C) (Oral)  Resp 16  SpO2 98%  LMP 08/28/2014 (Exact Date) Physical Exam  Constitutional: She appears well-developed and well-nourished. No distress.  HENT:  Head: Normocephalic and atraumatic.  Neck: Neck supple.  Cardiovascular: Normal rate.   Pulmonary/Chest: Effort normal.  Musculoskeletal: Normal range of motion. She exhibits no edema or tenderness.  Neurological: She is alert. She exhibits normal muscle  tone.  Skin: No rash noted. She is not diaphoretic. No erythema.  Back, bilateral buttocks, face with multiple diffuse pustules vs papules with scabbing tops, no erythema, edema, warmth, tenderness, or discharge.  No fluctuance.    Psychiatric: She has a normal mood and affect. Her behavior is normal.  Nursing note and vitals reviewed.   ED Course  Procedures (including critical care time) Labs Review Labs Reviewed - No data to display  Imaging Review No results found.   EKG Interpretation None      MDM   Final diagnoses:  Acne,  unspecified acne type    Afebrile, nontoxic patient with diffuse cystic acne without superinfection.  No area of abscess that would benefit from I&D.  No systemic symptoms.   D/C home with cleansing and topical acne products, hibiclens and benzoyl peroxide wash.  PCP resources for follow up.  Discussed result, findings, treatment, and follow up  with patient.  Pt given return precautions.  Pt verbalizes understanding and agrees with plan.        [place], PA-C 08/31/14 [id number]  Linwood Dibbles, MD 09/03/14 [phone]

## 2014-08-31 NOTE — Discharge Instructions (Signed)
Read the information below.  Use the prescribed medication as directed.  Please discuss all new medications with your pharmacist.  You may return to the Emergency Department at any time for worsening condition or any new symptoms that concern you.    If there is any possibility that you might be pregnant, please let your health care provider know and discuss this with the pharmacist to ensure medication safety.  If you develop redness, swelling, pus draining from the wound, or fevers greater than 100.4, return to the ER immediately for a recheck.     Acne Acne is a skin problem that causes small, red bumps (pimples). Acne happens when the tiny holes in your skin (pores) get blocked. Acne is most common on the face, neck, chest, and upper back. Your doctor can help you choose a treatment plan. It may take 2 months of treatment before your skin gets better. HOME CARE Good skin care is the most important part of treatment.  Wash your skin gently at least twice a day. Wash your skin after exercise. Always wash your skin before bed.  Use mild soap.  After you wash your face, put on a water-based face lotion.  Keep your hair off of your face. Wash your hair every day.  Only take medicines as told by your doctor.  Use a sunscreen or sunblock with SPF 30 or higher.  Choose makeup that does not block the holes in your skin (noncomedogenic).  Avoid leaning your chin or forehead on your hands.  Avoid wearing tight headbands or hats.  Avoid picking or squeezing your red bumps. This can make the problem worse and can leave scars. GET HELP RIGHT AWAY IF:   Your red bumps are not better after 8 weeks.  Your red bumps gets worse.  You have a large area of skin that is red or tender. MAKE SURE YOU:   Understand these instructions.  Will watch your condition.  Will get help right away if you are not doing well or get worse. Document Released: 04/13/2011 Document Revised: 07/17/2011 Document  Reviewed: 04/13/2011 [facility] Patient Information 2015 [place], [place]. This information is not intended to replace advice given to you by your health care provider. Make sure you discuss any questions you have with your health care provider.    Emergency Department Resource Guide 1) Find a Doctor and Pay Out of Pocket Although you won't have to find out who is covered by your insurance plan, it is a good idea to ask around and get recommendations. You will then need to call the office and see if the doctor you have chosen will accept you as a new patient and what types of options they offer for patients who are self-pay. Some doctors offer discounts or will set up payment plans for their patients who do not have insurance, but you will need to ask so you aren't surprised when you get to your appointment.  2) Contact Your Local Health Department Not all health departments have doctors that can see patients for sick visits, but many do, so it is worth a call to see if yours does. If you don't know where your local health department is, you can check in your phone book. The CDC also has a tool to help you locate your state's health department, and many state websites also have listings of all of their local health departments.  3) Find a Walk-in Clinic If your illness is not likely to be very severe or  complicated, you may want to try a walk in clinic. These are popping up all over the country in pharmacies, drugstores, and shopping centers. They're usually staffed by nurse practitioners or physician assistants that have been trained to treat common illnesses and complaints. They're usually fairly quick and inexpensive. However, if you have serious medical issues or chronic medical problems, these are probably not your best option.  No Primary Care Doctor: - Call Health Connect at  [phone] - they can help you locate a primary care doctor that  accepts your insurance, provides certain services,  etc. - Physician Referral Service- [phone]  Chronic Pain Problems: Organization         Address  Phone   Notes  [facility]  [phone] Patients need to be referred by their primary care doctor.   Medication Assistance: Organization         Address  Phone   Notes  [facility] Medication [facility] [address] [place] [place], [place] [address] [phone] --Must be a resident of [facility] -- Must have NO insurance coverage whatsoever (no Medicaid/ Medicare, etc.) -- The pt. MUST have a primary care doctor that directs their care regularly and follows them in the community   [facility]  [phone]   [employer]  [phone]    Agencies that provide inexpensive medical care: Organization         Address  Phone   Notes  [facility]  [phone]   [facility]    [phone]   [facility] [address] [place], [place] [address] [phone]   [facility] [address], [place] [phone]   Planned Parenthood    [phone]   [facility]    [phone]   [facility]  [address], [place] Phone:  [phone], Fax:  [phone] Hours of Operation:  9 am - 6 pm, M-F.  Also accepts Medicaid/Medicare and self-pay.  [facility]  [address], [place] Phone: [phone], Fax: [phone]. Hours of Operation:  8:30 am - 5:30 pm, M-F.  Also accepts Medicaid and self-pay.  [facility] [place] [address] [place] Phone: [phone]   [facility] [address], [place] [phone], Ext. [phone] Mondays & Thursdays: 7-9 AM.  First 15 patients are seen on a first come, first serve basis.    Medicaid-accepting [facility] Providers:  Organization         Address  Phone   Notes  [facility] [address], [place] [phone] Also accepts self-pay patients.  [facility] [address]urell Josephs [place], [place]  [phone]   [facility] [address], [place] [phone]   [facility] [address] [place] [phone]   Renaye Rakers [address], [place]   [phone] Only accepts [place] Access [place] patients after they have their name applied to their card.   Self-Pay (no insurance) in [facility]:  Organization         Address  Phone   Notes  Sickle Cell Patients, [facility] [address] [place], [place] [phone]   [facility] [address]  [address], [place] [phone]   [facility] [place]  [address],  [phone]   [facility]/Dr. Osei-Bonsu  [address] [place] or [address], [place] [phone] Phone number for both [place] and [place] locations is the same.  [facility] [address], [place] [phone]   [facility] [address] [place] or [address] [place] [address] [phone]   [facility] [address], [place] [phone], phone; [phone], fax Sees patients 1st and 3rd Saturday of every month.  Must not qualify for public or private insurance (i.e. Medicaid, Medicare, [place] Health Choice, Veterans' Benefits)  Household income should be no more than 200% of the poverty level The clinic cannot treat you if you are pregnant or think you are pregnant  Sexually transmitted diseases are not treated at the clinic.    Dental Care: Organization         Address  Phone  Notes  [facility] [address] [place], [place] [phone] Accepts children up to  age 67 who are enrolled in [place] or [place] Health Choice; pregnant women with a Medicaid card; and children who have applied for Medicaid or [place] Health Choice, but were declined, whose parents can pay a reduced fee at time of service.  [facility]  [address], [place] [phone] Accepts children up to age 11 who are enrolled in [place] or [place] Health Choice; pregnant women with a Medicaid card; and children who have applied for Medicaid or [place] Health Choice, but were declined, whose parents can pay a reduced fee at time of service.  [facility]  [address] [place], [place] [phone] Patients are seen by appointment only. Walk-ins are not accepted. [facility] will see patients 61 years of age and older. Monday - Tuesday (8am-5pm) Most Wednesdays (8:30-5pm) $30 per visit, cash only  [facility]  [address], [facility] [phone] Patients are seen by appointment only. Walk-ins are not accepted. [facility] will see patients 38 years of age and older. One Wednesday Evening (Monthly: Volunteer Based).  $30 per visit, cash only  [employer]  [phone] for adults; Children under age 87, call Graduate Pediatric Dentistry at [phone]. Children aged 75-14, please call [phone] to request a pediatric application.  Dental services are provided in all areas of dental care including fillings, crowns and bridges, complete and partial dentures, implants, gum treatment, root canals, and extractions. Preventive care is also provided. Treatment is provided to both adults and children. Patients are selected via a lottery and there is often a waiting list.   [facility] [address], [place]  [phone] [email or web address]   [facility] [address] [place], [place] [phone], Ext. [phone] Second and Fourth Thursday of  each month, opens at 6:30 AM; Clinic ends at 9 AM.  Patients are seen on a first-come first-served basis, and a limited number are seen during each clinic.   [facility]  [address] Ether Griffins [place], [place] [phone]   Eligibility Requirements You must have lived in [place], [place], or [place] counties for at least the last three months.   You cannot be  eligible for state or federal sponsored [place], including [employer], [place], or [employer].   You generally cannot be eligible for healthcare insurance through your employer.    How to apply: Eligibility screenings are held every Tuesday and Wednesday afternoon from 1:00 pm until 4:00 pm. You do not need an appointment for the interview!  [facility] [address] [place], [place] [phone]   [facility] Health Department  [phone]   [facility] Health Department  [phone]   [facility] Health Department  [phone]    Behavioral Health Resources in the Community: Intensive Outpatient Programs Organization         Address  Phone  Notes  [facility] [address], [place], [place] [phone]   [facility] [address], [place], [place] [phone]   [facility] [address] [place], [place]  [phone]   [facility] [address]  [place], [place] [phone] or [phone]   Substance Abuse Resources Organization         Address  Phone  Notes  [facility]  [phone]   [facility]  [phone]   [facility] [place]  [phone]   Floydene Flock  [phone]   Residential & Outpatient Substance Abuse Program  [phone]   Psychological Services Organization         Address  Phone  Notes  [facility]  [phone]   [facility] Services  [phone]   [facility] [address]  [place] [phone] or [phone]    Mobile Crisis Teams Organization         Address  Phone  Notes  [facility], Mobile Crisis Care Unit  [phone]   [facility]  [address] [place], [place] [phone]   Doristine Locks [address] [place] [phone]    Self-Help/Support Groups Organization         Address  Phone             Notes  [facility] [place] - variety of support groups  336- I7437963 Call for more information  Narcotics Anonymous (NA), [facility] [address], [employer] [place]  2 meetings at this location   Statistician         Address  Phone  Notes  [facility] [address],    [place]  [phone]   [facility]  [address] [place] [address], [place], [place] [phone]   [facility] [address] [place], [place] [phone] Admissions: 8am-3pm M-F  [facility] [address]    [place], [place] [phone]   [facility] [address] [place], [place], [place] [phone]   [facility] [address]  [place], [place] [phone]   [facility] - Intensive Outpatient [address]ure[address], [place], [place] [phone]   [facility] ([facility] [address] [place] [address]  [place], [place] [phone] or 4507562358   [facility] ([facility]) [address], [place], [place] [phone] Accepts Medicaid  [facility] [place] [address]  [place] [phone] Substance Abuse/Addiction Treatment   [facility] Organization         Address  Phone  Notes  [facility]  [phone]   Angie Fava, PhD [address],  [place]   [phone] or [phone]) [phone]   [facility]   [address] [place], [place] [phone]     [facility] [address] [place], [place] [phone] Insurance/Medicaid/sponsorship through [facility] and Families [address]                                    [place], [place] [phone] Therapy/tele-psych/case  [facility] [address]   [place], [place] [phone]    Dr. Lolly Mustache  [phone]   [facility]  [employer] [facility] Dept. 1) [address], [place] 2) [address] [place] 3)  [address], [place] [phone]  [phone]   [facility] Child Abuse Hotline [phone] or [phone] (After Hours)

## 2014-08-31 NOTE — ED Notes (Signed)
Pt c/o generalized, painful rash/sores x "months."  Pain score 5/10.  Pt reports that she has checked her house for bed bugs and other insects.  Sts she no new lotions, creams, soaps, and perfumes.

## 2014-08-31 NOTE — ED Notes (Signed)
PA at bedside.

## 2014-08-31 NOTE — Progress Notes (Signed)
[facility] ED CM consulted by ED CNA, Marchelle FolksAmanda who [facility], 3 ED visits in last 6 months and was inquiring about getting the orange card.   CM Called [phone] and spoke with pt about her interest in the orange card.  Pt agreed to a [facility].  CM completed referral Pt very appreciative of the call to assist her in getting medical services States she attempted to get services prior and was not eligible for affordable care act nor Medicaid

## 2014-09-10 ENCOUNTER — Emergency Department (HOSPITAL_COMMUNITY): Payer: Medicaid Other

## 2014-09-10 ENCOUNTER — Emergency Department (HOSPITAL_COMMUNITY)
Admission: EM | Admit: 2014-09-10 | Discharge: 2014-09-10 | Disposition: A | Discharge status: Home / Self Care | Payer: Medicaid Other | Attending: Emergency Medicine | Admitting: Emergency Medicine

## 2014-09-10 ENCOUNTER — Encounter (HOSPITAL_COMMUNITY): Payer: Self-pay | Admitting: Emergency Medicine

## 2014-09-10 DIAGNOSIS — Z8679 Personal history of other diseases of the circulatory system: Secondary | ICD-10-CM | POA: Insufficient documentation

## 2014-09-10 DIAGNOSIS — Z3202 Encounter for pregnancy test, result negative: Secondary | ICD-10-CM | POA: Insufficient documentation

## 2014-09-10 DIAGNOSIS — Z79899 Other long term (current) drug therapy: Secondary | ICD-10-CM | POA: Insufficient documentation

## 2014-09-10 DIAGNOSIS — N898 Other specified noninflammatory disorders of vagina: Secondary | ICD-10-CM | POA: Insufficient documentation

## 2014-09-10 DIAGNOSIS — Z9081 Acquired absence of spleen: Secondary | ICD-10-CM | POA: Insufficient documentation

## 2014-09-10 DIAGNOSIS — Z720 Tobacco use: Secondary | ICD-10-CM | POA: Insufficient documentation

## 2014-09-10 DIAGNOSIS — Z792 Long term (current) use of antibiotics: Secondary | ICD-10-CM | POA: Insufficient documentation

## 2014-09-10 DIAGNOSIS — Z8619 Personal history of other infectious and parasitic diseases: Secondary | ICD-10-CM | POA: Insufficient documentation

## 2014-09-10 DIAGNOSIS — Z7952 Long term (current) use of systemic steroids: Secondary | ICD-10-CM | POA: Insufficient documentation

## 2014-09-10 DIAGNOSIS — Z9889 Other specified postprocedural states: Secondary | ICD-10-CM | POA: Insufficient documentation

## 2014-09-10 DIAGNOSIS — Z9851 Tubal ligation status: Secondary | ICD-10-CM | POA: Insufficient documentation

## 2014-09-10 DIAGNOSIS — R109 Unspecified abdominal pain: Secondary | ICD-10-CM

## 2014-09-10 DIAGNOSIS — R1084 Generalized abdominal pain: Secondary | ICD-10-CM | POA: Insufficient documentation

## 2014-09-10 LAB — CBC WITH DIFFERENTIAL/PLATELET
BASOS ABS: 0 10*3/uL (ref 0.0–0.1)
BASOS PCT: 0 % (ref 0–1)
Eosinophils Absolute: 1 10*3/uL — ABNORMAL HIGH (ref 0.0–0.7)
Eosinophils Relative: 9 % — ABNORMAL HIGH (ref 0–5)
HEMATOCRIT: 36.1 % (ref 36.0–46.0)
Hemoglobin: 11.5 g/dL — ABNORMAL LOW (ref 12.0–15.0)
LYMPHS ABS: 2.7 10*3/uL (ref 0.7–4.0)
LYMPHS PCT: 25 % (ref 12–46)
MCH: 31.5 pg (ref 26.0–34.0)
MCHC: 31.9 g/dL (ref 30.0–36.0)
MCV: 98.9 fL (ref 78.0–100.0)
MONO ABS: 1.4 10*3/uL — AB (ref 0.1–1.0)
MONOS PCT: 13 % — AB (ref 3–12)
NEUTROS ABS: 5.9 10*3/uL (ref 1.7–7.7)
Neutrophils Relative %: 53 % (ref 43–77)
Platelets: 176 10*3/uL (ref 150–400)
RBC: 3.65 MIL/uL — AB (ref 3.87–5.11)
RDW: 15.9 % — AB (ref 11.5–15.5)
WBC: 11.1 10*3/uL — AB (ref 4.0–10.5)

## 2014-09-10 LAB — COMPREHENSIVE METABOLIC PANEL
ALBUMIN: 3.9 g/dL (ref 3.5–5.0)
ALT: 35 U/L (ref 14–54)
AST: 28 U/L (ref 15–41)
Alkaline Phosphatase: 93 U/L (ref 38–126)
Anion gap: 2 — ABNORMAL LOW (ref 5–15)
BILIRUBIN TOTAL: 0.5 mg/dL (ref 0.3–1.2)
BUN: 11 mg/dL (ref 6–20)
CHLORIDE: 108 mmol/L (ref 101–111)
CO2: 25 mmol/L (ref 22–32)
Calcium: 10.2 mg/dL (ref 8.9–10.3)
Creatinine, Ser: 0.55 mg/dL (ref 0.44–1.00)
GFR calc Af Amer: >60 mL/min (ref >60)
Glucose, Bld: 120 mg/dL — ABNORMAL HIGH (ref 70–99)
POTASSIUM: 4.3 mmol/L (ref 3.5–5.1)
SODIUM: 135 mmol/L (ref 135–145)
Total Protein: 7.6 g/dL (ref 6.5–8.1)

## 2014-09-10 LAB — URINALYSIS, ROUTINE W REFLEX MICROSCOPIC
BILIRUBIN URINE: NEGATIVE
GLUCOSE, UA: NEGATIVE mg/dL
HGB URINE DIPSTICK: NEGATIVE
KETONES UR: NEGATIVE mg/dL
Leukocytes, UA: NEGATIVE
Nitrite: NEGATIVE
PROTEIN: NEGATIVE mg/dL
Specific Gravity, Urine: 1.006 (ref 1.005–1.030)
Urobilinogen, UA: 0.2 mg/dL (ref 0.0–1.0)
pH: 6.5 (ref 5.0–8.0)

## 2014-09-10 LAB — WET PREP, GENITAL
Trich, Wet Prep: NONE SEEN
Yeast Wet Prep HPF POC: NONE SEEN

## 2014-09-10 LAB — POC URINE PREG, ED: PREG TEST UR: NEGATIVE

## 2014-09-10 MED ORDER — SODIUM CHLORIDE 0.9 % IV BOLUS (SEPSIS)
1000.0000 mL | Freq: Once | INTRAVENOUS | Status: AC
  Administered 2014-09-10: 1000 mL via INTRAVENOUS

## 2014-09-10 MED ORDER — FENTANYL CITRATE (PF) 100 MCG/2ML IJ SOLN
50.0000 ug | Freq: Once | INTRAMUSCULAR | Status: AC
  Administered 2014-09-10: 50 ug via INTRAVENOUS
  Filled 2014-09-10: qty 2

## 2014-09-10 MED ORDER — HYDROCODONE-ACETAMINOPHEN 5-325 MG PO TABS: 2.0000 | ORAL_TABLET | ORAL | Status: DC | PRN

## 2014-09-10 MED ORDER — IBUPROFEN 800 MG PO TABS: 800.0000 mg | ORAL_TABLET | Freq: Three times a day (TID) | ORAL | Status: DC

## 2014-09-10 MED ORDER — IOHEXOL 300 MG/ML  SOLN
100.0000 mL | Freq: Once | INTRAMUSCULAR | Status: AC | PRN
  Administered 2014-09-10: 100 mL via INTRAVENOUS

## 2014-09-10 MED ORDER — IOHEXOL 300 MG/ML  SOLN
50.0000 mL | Freq: Once | INTRAMUSCULAR | Status: AC | PRN
  Administered 2014-09-10: 50 mL via ORAL

## 2014-09-10 NOTE — ED Provider Notes (Signed)
CSN: [id number]     Arrival date & time 09/10/14  0903 History   First MD Initiated Contact with Patient 09/10/14 [phone]     Chief Complaint  Patient presents with  . Abdominal Pain     (Consider location/radiation/quality/duration/timing/severity/associated sxs/prior Treatment) HPI Rachel Mcdonald is a 34 y.o. female with a history of hepatitis C, tubal ligation comes in for evaluation of right-sided abdominal discomfort. Patient states since Monday she had a gradual onset pain in her right abdomen that she likens to "gas pains". She reports it may get better with bowel movement. It is worse with certain positions and after eating. She also reports associated abdominal distention that is unusual for her. Denies fevers, chills, nausea or vomiting, diarrhea or constipation, urinary symptoms, vaginal bleeding or discharge, pelvic pain. She reports her last menstrual period was 2 weeks ago and was normal for her. Reports normal bowel movements, most recent one at 7:20 this morning. No other aggravating modifying factors. Past Medical History  Diagnosis Date  . Hepatitis C   . Migraines    Past Surgical History  Procedure Laterality Date  . Splenectomy, total    . Chest tube placement    . C sections    . Dilation and curettage of uterus    . Tubal ligation     Family History  Problem Relation Age of Onset  . Diabetes Other    History  Substance Use Topics  . Smoking status: Current Every Day Smoker -- 1.00 packs/day    Types: Cigarettes  . Smokeless tobacco: Not on file  . Alcohol Use: No   OB History    No data available     Review of Systems A 10 point review of systems was completed and was negative except for pertinent positives and negatives as mentioned in the history of present illness     Allergies  Review of patient's allergies indicates no known allergies.  Home Medications   Prior to Admission medications   Medication Sig Start Date End Date Taking? Authorizing  Provider  acetaminophen (TYLENOL) 500 MG tablet Take 500 mg by mouth every 6 (six) hours as needed for headache.   Yes Historical Provider, MD  amphetamine-dextroamphetamine (ADDERALL) 30 MG tablet Take 30 mg by mouth 2 (two) times daily.   Yes Historical Provider, MD  Aspirin-Acetaminophen-Caffeine (GOODY HEADACHE PO) Take 1 packet by mouth every 6 (six) hours as needed (for pain).   Yes Historical Provider, MD  chlorhexidine (HIBICLENS) 4 % external liquid Apply topically daily as needed. Patient taking differently: Apply topically daily as needed (skin infection).  08/31/14  Yes Trixie DredgeEmily West, PA-C  FLUoxetine (PROZAC) 20 MG capsule Take 40 mg by mouth at bedtime.   Yes Historical Provider, MD  guaiFENesin (MUCINEX) 600 MG 12 hr tablet Take 600 mg by mouth 2 (two) times daily as needed for cough or to loosen phlegm.   Yes Historical Provider, MD  azithromycin (ZITHROMAX) 250 MG tablet Take 1 tablet (250 mg total) by mouth daily. Take first 2 tablets together, then 1 every day until finished. Patient not taking: Reported on 05/22/2014 04/28/14   Junius FinnerErin O'Malley, PA-C  benzoyl peroxide (BENZOYL PEROXIDE) 5 % external liquid Apply topically 2 (two) times daily. Patient not taking: Reported on 09/10/2014 08/31/14   Trixie DredgeEmily West, PA-C  clindamycin (CLEOCIN) 150 MG capsule Take 2 capsules (300 mg total) by mouth 3 (three) times daily. May dispense as 150mg  capsules Patient not taking: Reported on 04/28/2014 03/05/13   Yvonna AlanisKaitlyn  Szekalski, PA-C  HYDROcodone-acetaminophen (NORCO) 5-325 MG per tablet Take 2 tablets by mouth every 4 (four) hours as needed. 09/10/14   Joycie Peek, PA-C  ibuprofen (ADVIL,MOTRIN) 800 MG tablet Take 1 tablet (800 mg total) by mouth 3 (three) times daily. 09/10/14   Joycie Peek, PA-C  predniSONE (DELTASONE) 20 MG tablet 3 tabs po day one, then 2 po daily x 4 days Patient not taking: Reported on 05/22/2014 04/28/14   Junius Finner, PA-C   BP 132/82 mmHg  Pulse 76  Resp 19  SpO2 98%   LMP 08/28/2014 (Exact Date) Physical Exam  Constitutional: She is oriented to person, place, and time. She appears well-developed and well-nourished.  HENT:  Head: Normocephalic and atraumatic.  Mouth/Throat: Oropharynx is clear and moist.  Eyes: Conjunctivae are normal. Pupils are equal, round, and reactive to light. Right eye exhibits no discharge. Left eye exhibits no discharge. No scleral icterus.  Neck: Neck supple.  Cardiovascular: Normal rate, regular rhythm and normal heart sounds.   Pulmonary/Chest: Effort normal and breath sounds normal. No respiratory distress. She has no wheezes. She has no rales.  Abdominal: Soft.  Diffuse abdominal tenderness throughout the right side, specifically right lower quadrant. Abdomen is distended without peritoneal signs. Negative heel jar  Genitourinary:  Chaperone was present for the entire genital exam. No lesions or rashes appreciated on vulva. Cervix visualized on speculum exam and appropriate cultures sampled. Scant blood in vaginal vault. Discharge: white, thick Upon bi manual exam- No TTP of the adnexa, no cervical motion tenderness. No fullness or masses appreciated. No abnormalities appreciated in structural anatomy.   Musculoskeletal: She exhibits no tenderness.  Neurological: She is alert and oriented to person, place, and time.  Cranial Nerves II-XII grossly intact  Skin: Skin is warm and dry. No rash noted.  Psychiatric: She has a normal mood and affect.  Nursing note and vitals reviewed.   ED Course  Procedures (including critical care time) Labs Review Labs Reviewed  WET PREP, GENITAL - Abnormal; Notable for the following:    Clue Cells Wet Prep HPF POC FEW (*)    WBC, Wet Prep HPF POC MODERATE (*)    All other components within normal limits  CBC WITH DIFFERENTIAL/PLATELET - Abnormal; Notable for the following:    WBC 11.1 (*)    RBC 3.65 (*)    Hemoglobin 11.5 (*)    RDW 15.9 (*)    Monocytes Relative 13 (*)     Monocytes Absolute 1.4 (*)    Eosinophils Relative 9 (*)    Eosinophils Absolute 1.0 (*)    All other components within normal limits  COMPREHENSIVE METABOLIC PANEL - Abnormal; Notable for the following:    Glucose, Bld 120 (*)    Anion gap 2 (*)    All other components within normal limits  URINALYSIS, ROUTINE W REFLEX MICROSCOPIC  HIV ANTIBODY (ROUTINE TESTING)  POC URINE PREG, ED  GC/CHLAMYDIA PROBE AMP ([place])    Imaging Review Ct Abdomen Pelvis W Contrast  09/10/2014   CLINICAL DATA:  Mid to lower abdominal and pelvic pain with nausea for 2 days, past history of tubal ligation, splenectomy, smoking, hepatitis-C  EXAM: CT ABDOMEN AND PELVIS WITH CONTRAST  TECHNIQUE: Multidetector CT imaging of the abdomen and pelvis was performed using the standard protocol following bolus administration of intravenous contrast. Sagittal and coronal MPR images reconstructed from axial data set.  CONTRAST:  50mL OMNIPAQUE IOHEXOL 300 MG/ML SOLN P.O., OMNIPAQUE IOHEXOL 300 MG/ML SOLN IV  COMPARISON:  None  FINDINGS: Minimal dependent atelectasis at lung bases.  Spleen surgically absent with multiple soft tissue nodules in the LEFT upper quadrant likely representing splenosis largest 3.8 x 3.5 x 3.5 cm.  Dilated LEFT renal collecting system without obstructing urinary tract calcification.  Contracted gallbladder.  Liver, pancreas, kidneys, and adrenal glands otherwise unremarkable.  Normal appendix.  BILATERAL ovarian cysts RIGHT 3.9 x 3.2 cm image 59 and LEFT 3.3 x 2.9 cm image 57.  Unremarkable bladder, ureters, and uterus.  Minimal amount of nonspecific free fluid in cul de sac.  Small umbilical hernia containing fat.  Stomach and bowel loops unremarkable.  No additional mass, adenopathy, free air or definite inflammatory process.  Sclerosis at RIGHT ilium adjacent SI joint question osteitis condensans ilii.  IMPRESSION: Splenosis post splenectomy.  Dilatation of LEFT renal collecting system without  obvious ureteral dilatation or calculus, significance uncertain.  Small umbilical hernia.  BILATERAL ovarian cysts.  Minimal cul-de-sac free pelvic fluid.   Electronically Signed   By: Ulyses SouthwardMark  Boles M.D.   On: 09/10/2014 11:19     EKG Interpretation None     Meds given in ED:  Medications  sodium chloride 0.9 % bolus 1,000 mL (0 mLs Intravenous Stopped 09/10/14 1237)  iohexol (OMNIPAQUE) 300 MG/ML solution 50 mL (50 mLs Oral Contrast Given 09/10/14 0946)  iohexol (OMNIPAQUE) 300 MG/ML solution 100 mL (100 mLs Intravenous Contrast Given 09/10/14 1050)  fentaNYL (SUBLIMAZE) injection 50 mcg (50 mcg Intravenous Given 09/10/14 1258)    Discharge Medication List as of 09/10/2014  1:54 PM    START taking these medications   Details  HYDROcodone-acetaminophen (NORCO) 5-325 MG per tablet Take 2 tablets by mouth every 4 (four) hours as needed., Starting 09/10/2014, Until Discontinued, Print       Filed Vitals:   09/10/14 0911 09/10/14 1321  BP: 131/79 132/82  Pulse: 84 76  Resp: 18 19  SpO2: 100% 98%    MDM  Vitals stable - WNL -afebrile Pt resting comfortably in ED. PE--nonspecific, right-sided abdominal discomfort. Benign pelvic exam. Labwork--noncontributory. No evidence of UTI. Pregnancy negative Imaging--CT scan shows bilateral ovarian cysts less than 5 cm. Appendix normal  Patient with nonspecific right lower quadrant abdominal discomfort with normal CT scan, benign pelvic exam, stable vitals and labs. Will treat symptomatically with short course of pain medicines may have [facility]. Doubt ectopic, TOA, torsion. No evidence of acute or emergent pathology at this time. Will DC with short course pain medicines and strict return precautions I discussed all relevant lab findings and imaging results with pt and they verbalized understanding. Discussed f/u with PCP within 48 hrs and return precautions, pt very amenable to plan. Prior to patient discharge, I  discussed and reviewed this case with Dr. Rhunette CroftNanavati   Final diagnoses:  Abdominal discomfort       Joycie PeekBenjamin Danaly Bari, PA-C 09/10/14 1620  Derwood KaplanAnkit Nanavati, MD 09/10/14 [phone]

## 2014-09-10 NOTE — ED Notes (Signed)
Patient transported to CT 

## 2014-09-10 NOTE — Discharge Instructions (Signed)
Abdominal Pain, Women °Abdominal (stomach, pelvic, or belly) pain can be caused by many things. It is important to tell your doctor: °· The location of the pain. °· Does it come and go or is it present all the time? °· Are there things that start the pain (eating certain foods, exercise)? °· Are there other symptoms associated with the pain (fever, nausea, vomiting, diarrhea)? °All of this is helpful to know when trying to find the cause of the pain. °CAUSES  °· Stomach: virus or bacteria infection, or ulcer. °· Intestine: appendicitis (inflamed appendix), regional ileitis (Crohn's disease), ulcerative colitis (inflamed colon), irritable bowel syndrome, diverticulitis (inflamed diverticulum of the colon), or cancer of the stomach or intestine. °· Gallbladder disease or stones in the gallbladder. °· Kidney disease, kidney stones, or infection. °· Pancreas infection or cancer. °· Fibromyalgia (pain disorder). °· Diseases of the female organs: °¨ Uterus: fibroid (non-cancerous) tumors or infection. °¨ Fallopian tubes: infection or tubal pregnancy. °¨ Ovary: cysts or tumors. °¨ Pelvic adhesions (scar tissue). °¨ Endometriosis (uterus lining tissue growing in the pelvis and on the pelvic organs). °¨ Pelvic congestion syndrome (female organs filling up with blood just before the menstrual period). °¨ Pain with the menstrual period. °¨ Pain with ovulation (producing an egg). °¨ Pain with an IUD (intrauterine device, birth control) in the uterus. °¨ Cancer of the female organs. °· Functional pain (pain not caused by a disease, may improve without treatment). °· Psychological pain. °· Depression. °DIAGNOSIS  °Your doctor will decide the seriousness of your pain by doing an examination. °· Blood tests. °· X-rays. °· Ultrasound. °· CT scan (computed tomography, special type of X-ray). °· MRI (magnetic resonance imaging). °· Cultures, for infection. °· Barium enema (dye inserted in the large intestine, to better view it with  X-rays). °· Colonoscopy (looking in intestine with a lighted tube). °· Laparoscopy (minor surgery, looking in abdomen with a lighted tube). °· Major abdominal exploratory surgery (looking in abdomen with a large incision). °TREATMENT  °The treatment will depend on the cause of the pain.  °· Many cases can be observed and treated at home. °· Over-the-counter medicines recommended by your caregiver. °· Prescription medicine. °· Antibiotics, for infection. °· Birth control pills, for painful periods or for ovulation pain. °· Hormone treatment, for endometriosis. °· Nerve blocking injections. °· Physical therapy. °· Antidepressants. °· Counseling with a psychologist or psychiatrist. °· Minor or major surgery. °HOME CARE INSTRUCTIONS  °· Do not take laxatives, unless directed by your caregiver. °· Take over-the-counter pain medicine only if ordered by your caregiver. Do not take aspirin because it can cause an upset stomach or bleeding. °· Try a clear liquid diet (broth or water) as ordered by your caregiver. Slowly move to a bland diet, as tolerated, if the pain is related to the stomach or intestine. °· Have a thermometer and take your temperature several times a day, and record it. °· Bed rest and sleep, if it helps the pain. °· Avoid sexual intercourse, if it causes pain. °· Avoid stressful situations. °· Keep your follow-up appointments and tests, as your caregiver orders. °· If the pain does not go away with medicine or surgery, you may try: °¨ Acupuncture. °¨ Relaxation exercises (yoga, meditation). °¨ Group therapy. °¨ Counseling. °SEEK MEDICAL CARE IF:  °· You notice certain foods cause stomach pain. °· Your home care treatment is not helping your pain. °· You need stronger pain medicine. °· You want your IUD removed. °· You feel faint or   lightheaded.  You develop nausea and vomiting.  You develop a rash.  You are having side effects or an allergy to your medicine. SEEK IMMEDIATE MEDICAL CARE IF:   Your  pain does not go away or gets worse.  You have a fever.  Your pain is felt only in portions of the abdomen. The right side could possibly be appendicitis. The left lower portion of the abdomen could be colitis or diverticulitis.  You are passing blood in your stools (bright red or black tarry stools, with or without vomiting).  You have blood in your urine.  You develop chills, with or without a fever.  You pass out. MAKE SURE YOU:   Understand these instructions.  Will watch your condition.  Will get help right away if you are not doing well or get worse. Document Released: 02/19/2007 Document Revised: 09/08/2013 Document Reviewed: 03/11/2009 [facility] Patient Information 2015 [place]. This information is not intended to replace advice given to you by your health care provider. Make sure you discuss any questions you have with your health care provider.  You were evaluated in ED states her abdominal discomfort. Your labs and a CT and physical exam are all reassuring and do not show any emergent problems. It is important. Follow-up with your primary care/[facility] center for further evaluation and management of your symptoms. You may take your medications as directed. Do not take pain medicines before driving or operating machinery. Return to ED for new or worsening symptoms.

## 2014-09-10 NOTE — ED Notes (Signed)
Pt, states she has had abdominal pain since Monday-abdomin distended-having normal BMs

## 2014-09-11 LAB — GC/CHLAMYDIA PROBE AMP (~~LOC~~) NOT AT ARMC
CHLAMYDIA, DNA PROBE: NEGATIVE
NEISSERIA GONORRHEA: NEGATIVE

## 2014-09-11 LAB — HIV ANTIBODY (ROUTINE TESTING W REFLEX): HIV SCREEN 4TH GENERATION: NONREACTIVE

## 2015-03-15 ENCOUNTER — Emergency Department (HOSPITAL_COMMUNITY)
Admission: EM | Admit: 2015-03-15 | Discharge: 2015-03-15 | Disposition: A | Discharge status: Home / Self Care | Payer: Medicaid Other | Attending: Emergency Medicine | Admitting: Emergency Medicine

## 2015-03-15 ENCOUNTER — Encounter (HOSPITAL_COMMUNITY): Payer: Self-pay | Admitting: Emergency Medicine

## 2015-03-15 ENCOUNTER — Emergency Department (HOSPITAL_COMMUNITY): Payer: Medicaid Other

## 2015-03-15 DIAGNOSIS — Z720 Tobacco use: Secondary | ICD-10-CM | POA: Diagnosis not present

## 2015-03-15 DIAGNOSIS — N200 Calculus of kidney: Secondary | ICD-10-CM | POA: Insufficient documentation

## 2015-03-15 DIAGNOSIS — Z8619 Personal history of other infectious and parasitic diseases: Secondary | ICD-10-CM | POA: Insufficient documentation

## 2015-03-15 DIAGNOSIS — Z79899 Other long term (current) drug therapy: Secondary | ICD-10-CM | POA: Insufficient documentation

## 2015-03-15 DIAGNOSIS — G43909 Migraine, unspecified, not intractable, without status migrainosus: Secondary | ICD-10-CM | POA: Diagnosis not present

## 2015-03-15 DIAGNOSIS — R109 Unspecified abdominal pain: Secondary | ICD-10-CM

## 2015-03-15 DIAGNOSIS — Z3202 Encounter for pregnancy test, result negative: Secondary | ICD-10-CM | POA: Diagnosis not present

## 2015-03-15 LAB — CBC WITH DIFFERENTIAL/PLATELET
BASOS PCT: 0 %
Basophils Absolute: 0 10*3/uL (ref 0.0–0.1)
Eosinophils Absolute: 1.1 10*3/uL — ABNORMAL HIGH (ref 0.0–0.7)
Eosinophils Relative: 10 %
HEMATOCRIT: 37.5 % (ref 36.0–46.0)
Hemoglobin: 12.1 g/dL (ref 12.0–15.0)
LYMPHS ABS: 2.5 10*3/uL (ref 0.7–4.0)
Lymphocytes Relative: 23 %
MCH: 32.6 pg (ref 26.0–34.0)
MCHC: 32.3 g/dL (ref 30.0–36.0)
MCV: 101.1 fL — ABNORMAL HIGH (ref 78.0–100.0)
MONOS PCT: 14 %
Monocytes Absolute: 1.5 10*3/uL — ABNORMAL HIGH (ref 0.1–1.0)
NEUTROS ABS: 5.9 10*3/uL (ref 1.7–7.7)
NEUTROS PCT: 53 %
Platelets: 154 10*3/uL (ref 150–400)
RBC: 3.71 MIL/uL — AB (ref 3.87–5.11)
RDW: 15.3 % (ref 11.5–15.5)
WBC: 11 10*3/uL — ABNORMAL HIGH (ref 4.0–10.5)

## 2015-03-15 LAB — URINALYSIS, ROUTINE W REFLEX MICROSCOPIC
Bilirubin Urine: NEGATIVE
Glucose, UA: NEGATIVE mg/dL
Ketones, ur: NEGATIVE mg/dL
Leukocytes, UA: NEGATIVE
Nitrite: NEGATIVE
PH: 6.5 (ref 5.0–8.0)
Protein, ur: NEGATIVE mg/dL
SPECIFIC GRAVITY, URINE: 1.013 (ref 1.005–1.030)
UROBILINOGEN UA: 0.2 mg/dL (ref 0.0–1.0)

## 2015-03-15 LAB — POC URINE PREG, ED: Preg Test, Ur: NEGATIVE

## 2015-03-15 LAB — COMPREHENSIVE METABOLIC PANEL
ALBUMIN: 4 g/dL (ref 3.5–5.0)
ALK PHOS: 72 U/L (ref 38–126)
ALT: 43 U/L (ref 14–54)
ANION GAP: 3 — AB (ref 5–15)
AST: 32 U/L (ref 15–41)
BILIRUBIN TOTAL: 0.3 mg/dL (ref 0.3–1.2)
BUN: 14 mg/dL (ref 6–20)
CO2: 28 mmol/L (ref 22–32)
Calcium: 11 mg/dL — ABNORMAL HIGH (ref 8.9–10.3)
Chloride: 104 mmol/L (ref 101–111)
Creatinine, Ser: 0.75 mg/dL (ref 0.44–1.00)
GFR calc Af Amer: >60 mL/min (ref >60)
GFR calc non Af Amer: >60 mL/min (ref >60)
Glucose, Bld: 124 mg/dL — ABNORMAL HIGH (ref 65–99)
Potassium: 4.7 mmol/L (ref 3.5–5.1)
Sodium: 135 mmol/L (ref 135–145)
TOTAL PROTEIN: 7.5 g/dL (ref 6.5–8.1)

## 2015-03-15 LAB — LIPASE, BLOOD: Lipase: 28 U/L (ref 11–51)

## 2015-03-15 LAB — URINE MICROSCOPIC-ADD ON

## 2015-03-15 MED ORDER — KETOROLAC TROMETHAMINE 30 MG/ML IJ SOLN
30.0000 mg | Freq: Once | INTRAMUSCULAR | Status: AC
  Administered 2015-03-15: 30 mg via INTRAVENOUS
  Filled 2015-03-15: qty 1

## 2015-03-15 MED ORDER — OXYCODONE-ACETAMINOPHEN 5-325 MG PO TABS
1.0000 | ORAL_TABLET | Freq: Once | ORAL | Status: AC
Start: 2015-03-15 — End: 2015-03-15
  Administered 2015-03-15: 1 via ORAL
  Filled 2015-03-15: qty 1

## 2015-03-15 MED ORDER — IBUPROFEN 600 MG PO TABS
600.0000 mg | ORAL_TABLET | Freq: Four times a day (QID) | ORAL | Status: DC | PRN
Start: 2015-03-15 — End: 2015-09-16

## 2015-03-15 MED ORDER — OXYCODONE-ACETAMINOPHEN 5-325 MG PO TABS: 1.0000 | ORAL_TABLET | ORAL | Status: DC | PRN

## 2015-03-15 MED ORDER — HYDROMORPHONE HCL 1 MG/ML IJ SOLN
1.0000 mg | Freq: Once | INTRAMUSCULAR | Status: AC
  Administered 2015-03-15: 1 mg via INTRAVENOUS
  Filled 2015-03-15: qty 1

## 2015-03-15 MED ORDER — PROMETHAZINE HCL 25 MG PO TABS
25.0000 mg | ORAL_TABLET | Freq: Four times a day (QID) | ORAL | Status: DC | PRN
Start: 2015-03-15 — End: 2015-09-20

## 2015-03-15 MED ORDER — TAMSULOSIN HCL 0.4 MG PO CAPS: 0.4000 mg | ORAL_CAPSULE | Freq: Every day | ORAL | Status: DC

## 2015-03-15 MED ORDER — ONDANSETRON HCL 4 MG/2ML IJ SOLN
4.0000 mg | Freq: Once | INTRAMUSCULAR | Status: AC
  Administered 2015-03-15: 4 mg via INTRAVENOUS
  Filled 2015-03-15: qty 2

## 2015-03-15 MED ORDER — HYDROMORPHONE HCL 1 MG/ML IJ SOLN
0.5000 mg | Freq: Once | INTRAMUSCULAR | Status: AC
  Administered 2015-03-15: 0.5 mg via INTRAVENOUS
  Filled 2015-03-15: qty 1

## 2015-03-15 NOTE — ED Notes (Signed)
Per pt, states left flank pian since yesterday-states dark urine

## 2015-03-15 NOTE — Discharge Instructions (Signed)
Ibuprofen for pain. Percocet for severe pain. Flomax to help you pass the stone daily. Take Phenergan for nausea and vomiting. Follow-up with urology or return if symptoms worsening.  Kidney Stones Kidney stones (urolithiasis) are deposits that form inside your kidneys. The intense pain is caused by the stone moving through the urinary tract. When the stone moves, the ureter goes into spasm around the stone. The stone is usually passed in the urine.  CAUSES   A disorder that makes certain neck glands produce too much parathyroid hormone (primary hyperparathyroidism).  A buildup of uric acid crystals, similar to gout in your joints.  Narrowing (stricture) of the ureter.  A kidney obstruction present at birth (congenital obstruction).  Previous surgery on the kidney or ureters.  Numerous kidney infections. SYMPTOMS   Feeling sick to your stomach (nauseous).  Throwing up (vomiting).  Blood in the urine (hematuria).  Pain that usually spreads (radiates) to the groin.  Frequency or urgency of urination. DIAGNOSIS   Taking a history and physical exam.  Blood or urine tests.  CT scan.  Occasionally, an examination of the inside of the urinary bladder (cystoscopy) is performed. TREATMENT   Observation.  Increasing your fluid intake.  Extracorporeal shock wave lithotripsy--This is a noninvasive procedure that uses shock waves to break up kidney stones.  Surgery may be needed if you have severe pain or persistent obstruction. There are various surgical procedures. Most of the procedures are performed with the use of small instruments. Only small incisions are needed to accommodate these instruments, so recovery time is minimized. The size, location, and chemical composition are all important variables that will determine the proper choice of action for you. Talk to your health care provider to better understand your situation so that you will minimize the risk of injury to  yourself and your kidney.  HOME CARE INSTRUCTIONS   Drink enough water and fluids to keep your urine clear or pale yellow. This will help you to pass the stone or stone fragments.  Strain all urine through the provided strainer. Keep all particulate matter and stones for your health care provider to see. The stone causing the pain may be as small as a grain of salt. It is very important to use the strainer each and every time you pass your urine. The collection of your stone will allow your health care provider to analyze it and verify that a stone has actually passed. The stone analysis will often identify what you can do to reduce the incidence of recurrences.  Only take over-the-counter or prescription medicines for pain, discomfort, or fever as directed by your health care provider.  Keep all follow-up visits as told by your health care provider. This is important.  Get follow-up X-rays if required. The absence of pain does not always mean that the stone has passed. It may have only stopped moving. If the urine remains completely obstructed, it can cause loss of kidney function or even complete destruction of the kidney. It is your responsibility to make sure X-rays and follow-ups are completed. Ultrasounds of the kidney can show blockages and the status of the kidney. Ultrasounds are not associated with any radiation and can be performed easily in a matter of minutes.  Make changes to your daily diet as told by your health care provider. You may be told to:  Limit the amount of salt that you eat.  Eat 5 or more servings of fruits and vegetables each day.  Limit the amount of  meat, poultry, fish, and eggs that you eat.  Collect a 24-hour urine sample as told by your health care provider.You may need to collect another urine sample every 6-12 months. SEEK MEDICAL CARE IF:  You experience pain that is progressive and unresponsive to any pain medicine you have been prescribed. SEEK  IMMEDIATE MEDICAL CARE IF:   Pain cannot be controlled with the prescribed medicine.  You have a fever or shaking chills.  The severity or intensity of pain increases over 18 hours and is not relieved by pain medicine.  You develop a new onset of abdominal pain.  You feel faint or pass out.  You are unable to urinate.   This information is not intended to replace advice given to you by your health care provider. Make sure you discuss any questions you have with your health care provider.   Document Released: 04/24/2005 Document Revised: 01/13/2015 Document Reviewed: 09/25/2012 Elsevier Interactive Patient Education 2016 [employer]   Dietary Guidelines to Help Prevent Kidney Stones Your risk of kidney stones can be decreased by adjusting the foods you eat. The most important thing you can do is drink enough fluid. You should drink enough fluid to keep your urine clear or pale yellow. The following guidelines provide specific information for the type of kidney stone you have had. GUIDELINES ACCORDING TO TYPE OF KIDNEY STONE Calcium Oxalate Kidney Stones  Reduce the amount of salt you eat. Foods that have a lot of salt cause your body to release excess calcium into your urine. The excess calcium can combine with a substance called oxalate to form kidney stones.  Reduce the amount of animal protein you eat if the amount you eat is excessive. Animal protein causes your body to release excess calcium into your urine. Ask your dietitian how much protein from animal sources you should be eating.  Avoid foods that are high in oxalates. If you take vitamins, they should have less than 500 mg of vitamin C. Your body turns vitamin C into oxalates. You do not need to avoid fruits and vegetables high in vitamin C. Calcium Phosphate Kidney Stones  Reduce the amount of salt you eat to help prevent the release of excess calcium into your urine.  Reduce the amount of animal protein you eat if the  amount you eat is excessive. Animal protein causes your body to release excess calcium into your urine. Ask your dietitian how much protein from animal sources you should be eating.  Get enough calcium from food or take a calcium supplement (ask your dietitian for recommendations). Food sources of calcium that do not increase your risk of kidney stones include:  Broccoli.  Dairy products, such as cheese and yogurt.  Pudding. Uric Acid Kidney Stones  Do not have more than 6 oz of animal protein per day. FOOD SOURCES Animal Protein Sources  Meat (all types).  Poultry.  Eggs.  Fish, seafood. Foods High in MirantSalt  Salt seasonings.  Soy sauce.  Teriyaki sauce.  Cured and processed meats.  Salted crackers and snack foods.  Fast food.  Canned soups and most canned foods. Foods High in Oxalates  Grains:  Amaranth.  Barley.  Grits.  Wheat germ.  Bran.  Buckwheat flour.  All bran cereals.  Pretzels.  Whole wheat bread.  Vegetables:  Beans (wax).  Beets and beet greens.  Collard greens.  Eggplant.  Escarole.  Leeks.  Okra.  Parsley.  Rutabagas.  Spinach.  Swiss chard.  Tomato paste.  Fried potatoes.  Sweet potatoes.  Fruits:  Red currants.  Figs.  Kiwi.  Rhubarb.  Meat and Other Protein Sources:  Beans (dried).  Soy burgers and other soybean products.  Miso.  Nuts (peanuts, almonds, pecans, cashews, hazelnuts).  Nut butters.  Sesame seeds and tahini (paste made of sesame seeds).  Poppy seeds.  Beverages:  Chocolate drink mixes.  Soy milk.  Instant iced tea.  Juices made from high-oxalate fruits or vegetables.  Other:  Carob.  Chocolate.  Fruitcake.  Marmalades.   This information is not intended to replace advice given to you by your health care provider. Make sure you discuss any questions you have with your health care provider.   Document Released: 08/19/2010 Document Revised: 04/29/2013  Document Reviewed: 03/21/2013 Elsevier Interactive Patient Education [employer]

## 2015-03-15 NOTE — ED Provider Notes (Signed)
CSN: [id number]     Arrival date & time 03/15/15  9629 History   First MD Initiated Contact with Patient 03/15/15 0757     Chief Complaint  Patient presents with  . Flank Pain     (Consider location/radiation/quality/duration/timing/severity/associated sxs/prior Treatment) HPI Rachel Mcdonald is a 34 y.o. female with history of hepatitis C and migraines presents to emergency. Complaining of left flank pain and dark colored urine. Patient states that her flank pain started yesterday. States it radiates in the left upper quadrant of abdomen and in the left groin. She denies any fever or chills. Denies urinary symptoms. States that her urine is dark in color since yesterday as well. She reports nausea, no vomiting. Denies diarrhea. No blood in her stool or emesis. Denies any fever. No history of similar pain in the past. Patient has history of total splenectomy. Patient states she took ibuprofen and Fioricet for pain yesterday with no improvement. States this morning. Significantly worsened. She describes it as sharp, stabbing, unable to find a comfortable position.  Past Medical History  Diagnosis Date  . Hepatitis C   . Migraines    Past Surgical History  Procedure Laterality Date  . Splenectomy, total    . Chest tube placement    . C sections    . Dilation and curettage of uterus    . Tubal ligation     Family History  Problem Relation Age of Onset  . Diabetes Other    Social History  Substance Use Topics  . Smoking status: Current Every Day Smoker -- 1.00 packs/day    Types: Cigarettes  . Smokeless tobacco: None  . Alcohol Use: No   OB History    No data available     Review of Systems  Constitutional: Negative for fever and chills.  Respiratory: Negative for cough, chest tightness and shortness of breath.   Cardiovascular: Negative for chest pain, palpitations and leg swelling.  Gastrointestinal: Positive for nausea and abdominal pain. Negative for vomiting and diarrhea.   Genitourinary: Positive for flank pain. Negative for dysuria, urgency, frequency, vaginal bleeding, vaginal discharge, difficulty urinating, vaginal pain and pelvic pain.  Musculoskeletal: Negative for myalgias, arthralgias, neck pain and neck stiffness.  Skin: Negative for rash.  Neurological: Negative for dizziness, weakness and headaches.  All other systems reviewed and are negative.     Allergies  Review of patient's allergies indicates no known allergies.  Home Medications   Prior to Admission medications   Medication Sig Start Date End Date Taking? Authorizing Provider  acetaminophen (TYLENOL) 500 MG tablet Take 500 mg by mouth every 6 (six) hours as needed for headache.    Historical Provider, MD  amphetamine-dextroamphetamine (ADDERALL) 30 MG tablet Take 30 mg by mouth 2 (two) times daily.    Historical Provider, MD  Aspirin-Acetaminophen-Caffeine (GOODY HEADACHE PO) Take 1 packet by mouth every 6 (six) hours as needed (for pain).    Historical Provider, MD  azithromycin (ZITHROMAX) 250 MG tablet Take 1 tablet (250 mg total) by mouth daily. Take first 2 tablets together, then 1 every day until finished. Patient not taking: Reported on 05/22/2014 04/28/14   Junius Finner, PA-C  benzoyl peroxide (BENZOYL PEROXIDE) 5 % external liquid Apply topically 2 (two) times daily. Patient not taking: Reported on 09/10/2014 08/31/14   Trixie Dredge, PA-C  chlorhexidine (HIBICLENS) 4 % external liquid Apply topically daily as needed. Patient taking differently: Apply topically daily as needed (skin infection).  08/31/14   Trixie Dredge, PA-C  clindamycin (CLEOCIN) 150 MG capsule Take 2 capsules (300 mg total) by mouth 3 (three) times daily. May dispense as  capsules Patient not taking: Reported on 04/28/2014 03/05/13   Emilia Beck, PA-C  FLUoxetine (PROZAC) 20 MG capsule Take 40 mg by mouth at bedtime.    Historical Provider, MD  guaiFENesin (MUCINEX) 600 MG 12 hr tablet Take 600 mg by mouth 2  (two) times daily as needed for cough or to loosen phlegm.    Historical Provider, MD  HYDROcodone-acetaminophen (NORCO) 5-325 MG per tablet Take 2 tablets by mouth every 4 (four) hours as needed. 09/10/14   Joycie Peek, PA-C  ibuprofen (ADVIL,MOTRIN) 800 MG tablet Take 1 tablet (800 mg total) by mouth 3 (three) times daily. 09/10/14   Joycie Peek, PA-C  predniSONE (DELTASONE) 20 MG tablet 3 tabs po day one, then 2 po daily x 4 days Patient not taking: Reported on 05/22/2014 04/28/14   Junius Finner, PA-C   BP 121/82 mmHg  Pulse 60  Temp(Src)   Resp 18  SpO2 97%  LMP 03/08/2015 Physical Exam  Constitutional: She appears well-developed and well-nourished. No distress.  HENT:  Head: Normocephalic.  Eyes: Conjunctivae are normal.  Neck: Neck supple.  Cardiovascular: Normal rate, regular rhythm and normal heart sounds.   Pulmonary/Chest: Effort normal and breath sounds normal. No respiratory distress. She has no wheezes. She has no rales.  Abdominal: Soft. Bowel sounds are normal. She exhibits no distension. There is tenderness. There is no rebound.  Mild LUQ tenderness. No CVA tenderness bilaterally  Musculoskeletal: She exhibits no edema.  Neurological: She is alert.  Skin: Skin is warm and dry.  Psychiatric: She has a normal mood and affect. Her behavior is normal.  Nursing note and vitals reviewed.   ED Course  Procedures (including critical care time) Labs Review Labs Reviewed  CBC WITH DIFFERENTIAL/PLATELET - Abnormal; Notable for the following:    WBC 11.0 (*)    RBC 3.71 (*)    MCV 101.1 (*)    Monocytes Absolute 1.5 (*)    Eosinophils Absolute 1.1 (*)    All other components within normal limits  COMPREHENSIVE METABOLIC PANEL - Abnormal; Notable for the following:    Glucose, Bld 124 (*)    Calcium 11.0 (*)    Anion gap 3 (*)    All other components within normal limits  URINALYSIS, ROUTINE W REFLEX MICROSCOPIC (NOT AT [facility]) - Abnormal; Notable for the  following:    APPearance TURBID (*)    Hgb urine dipstick LARGE (*)    All other components within normal limits  URINE MICROSCOPIC-ADD ON - Abnormal; Notable for the following:    Squamous Epithelial / LPF MANY (*)    Casts GRANULAR CAST (*)    All other components within normal limits  LIPASE, BLOOD  POC URINE PREG, ED    Imaging Review Ct Renal Stone Study  03/15/2015  CLINICAL DATA:  Left flank pain beginning yesterday. Dark urine. Possible kidney stone. EXAM: CT ABDOMEN AND PELVIS WITHOUT CONTRAST TECHNIQUE: Multidetector CT imaging of the abdomen and pelvis was performed following the standard protocol without IV contrast. COMPARISON:  09/10/2014 FINDINGS: Subsegmental atelectasis is noted in both scratched it dependent subsegmental atelectasis is noted in both lower lobes with atelectasis versus scarring in the lingula. No pleural effusion. The liver, gallbladder, adrenal glands, and pancreas have an unremarkable unenhanced appearance. 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 Individual splenic nodules measure up to  3.8 cm in size, unchanged. There are 2-3 punctate nonobstructing calculi in the interpolar and lower pole regions of the right kidney. There is mild left hydronephrosis and proximal hydroureter due to a 3 mm calculus in the proximal ureter. There is mild left periureteral stranding. The small and large bowel are nondilated without evidence of obstruction. Appendix is unremarkable. The bladder is decompressed. Uterus and ovaries are identified. 3.2 x 2.9 cm left ovarian cyst is unchanged in size. No free fluid or enlarged lymph nodes are identified. A small fat containing umbilical hernia is again noted. 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 IMPRESSION: 1. Obstructing 3 mm calculus in the proximal left ureter with mild hydronephrosis. 2. Punctate nonobstructing right  nephrolithiasis. Electronically Signed   By: Sebastian AcheAllen  Grady M.D.   On: 03/15/2015 09:35   I have personally reviewed and evaluated these images and lab results as part of my medical decision-making.   EKG Interpretation None      MDM   Final diagnoses:  Left flank pain  Kidney stone on left side   8:19 AM  Patient seen and examined. Patient with left flank pain onset yesterday. Patient appears to be uncomfortable, rolling around side-to-side in the stretcher. No significant abdominal tenderness or CVA tenderness. Will get a UA, labs, CT for evaluation of possible kidney stone. Pain medications and fluids ordered.  10:49 AM CT showing left renal stone, 3mm at proximal ureter, pt feels better after toradol and dilaudid. She states pain resolved. No signs of infection. Afebrile. Non toxic appearing. Will try to manage at home with percocet, phenergan, flomax. Follow up with urology.   Filed Vitals:   03/15/15 0803 03/15/15 0830 03/15/15 0853 03/15/15 0900  BP: 121/82 122/69  112/67  Pulse: 60 64  76  Temp:   97.8 F (36.6 C)   Resp: 18     SpO2: 97% 97%  90%     Jaynie Crumbleatyana Nijah Orlich, PA-C 03/15/15 1706  Lavera Guiseana Duo Liu, MD 03/15/15 1921

## 2015-08-23 ENCOUNTER — Other Ambulatory Visit: Payer: Self-pay | Admitting: General Surgery

## 2015-08-23 NOTE — H&P (Signed)
History of Present Illness Rachel Mcdonald(Rachel Bribiesca MD; 08/23/2015 12:15 PM) Patient words: anal warts.  The patient is a 35 year old female who presents with anal lesions. 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 She states that the areas have bled on occasion and get inflamed and itch and burn sometimes as well. She has not tried any over-the-counter therapies. She is having regular bowel movements and denies any constipation or diarrhea. She denies any vaginal lesions. She has a history of Chlamydia and Trichomonas. Her most recent HIV test was negative.   Other Problems Rachel Mcdonald(Rachel Dockery, LPN; 1/61/09604/17/2017 45:4011:30 AM) Alcohol Abuse Anxiety Disorder Asthma Back Pain Depression Gastric Ulcer Gastroesophageal Reflux Disease Hemorrhoids Hepatitis Hypercholesterolemia Kidney Stone Migraine Headache Transfusion history  Past Surgical History Rachel Mcdonald(Rachel Dockery, LPN; 9/81/19144/17/2017 78:2911:30 AM) Cesarean Section - Multiple Liver Surgery Lung Surgery Left. Oral Surgery Splenectomy  Diagnostic Studies History Rachel Mcdonald(Rachel Dockery, LPN; 5/62/13084/17/2017 65:7811:30 AM) Colonoscopy never Mammogram never Pap Smear 1-5 years ago  Allergies Rachel Mcdonald(Rachel Dockery, LPN; 4/69/62954/17/2017 28:4111:31 AM) No Known Drug Allergies 08/23/2015  Medication History Rachel Mcdonald(Rachel Dockery, LPN; 3/24/40104/17/2017 27:2511:33 AM) Adderall (30MG  Tablet, Oral) Active. PROzac (20MG  Capsule, Oral) Active. Symbicort (160-4.5MCG/ACT Aerosol, Inhalation) Active. Medications Reconciled  Social History Rachel Mcdonald(Rachel Dockery, LPN; 3/66/44034/17/2017 47:4211:30 AM) Alcohol use Remotely quit alcohol use. Caffeine use Carbonated beverages, Coffee, Tea. Illicit drug use Remotely quit drug use. Tobacco use Current every day smoker.  Family History Rachel Mcdonald(Rachel Dockery, LPN; 5/95/63874/17/2017 56:4311:30 AM) Alcohol Abuse Brother, Family Members In General, Father, Mother. Arthritis Family Members In General. Bleeding  disorder Mother. Breast Cancer Family Members In General. Depression Brother, Family Members In General, Father, Mother. Diabetes Mellitus Family Members In General, Father. Heart Disease Family Members In General. Ischemic Bowel Disease Family Members In General. Migraine Headache Brother, Family Members In General, Father. Respiratory Condition Family Members In General, Father. Seizure disorder Brother, Family Members In General.  Pregnancy / Birth History Rachel Mcdonald(Rachel Dockery, LPN; 3/29/51884/17/2017 41:6611:30 AM) Age at menarche 8 years. Contraceptive History Depo-provera, Oral contraceptives. Gravida 6 Maternal age 36-20 Para 3 Regular periods     Review of Systems Rachel Mcdonald(Rachel Dockery LPN; 0/63/01604/17/2017 10:9311:30 AM) General Present- Night Sweats, Weight Gain and Weight Loss. Not Present- Appetite Loss, Chills, Fatigue and Fever. Skin Present- Change in Wart/Mole, Dryness, Hives and Rash. Not Present- Jaundice, New Lesions, Non-Healing Wounds and Ulcer. HEENT Present- Seasonal Allergies. Not Present- Earache, Hearing Loss, Hoarseness, Nose Bleed, Oral Ulcers, Ringing in the Ears, Sinus Pain, Sore Throat, Visual Disturbances, Wears glasses/contact lenses and Yellow Eyes. Respiratory Present- Chronic Cough and Wheezing. Not Present- Bloody sputum, Difficulty Breathing and Snoring. Breast Not Present- Breast Mass, Breast Pain, Nipple Discharge and Skin Changes. Cardiovascular Present- Shortness of Breath. Not Present- Chest Pain, Difficulty Breathing Lying Down, Leg Cramps, Palpitations, Rapid Heart Rate and Swelling of Extremities. Gastrointestinal Present- Abdominal Pain, Bloating, Bloody Stool, Hemorrhoids and Nausea. Not Present- Change in Bowel Habits, Chronic diarrhea, Constipation, Difficulty Swallowing, Excessive gas, Gets full quickly at meals, Indigestion, Rectal Pain and Vomiting. Female Genitourinary Not Present- Frequency, Nocturia, Painful Urination, Pelvic Pain and  Urgency. Musculoskeletal Present- Muscle Weakness. Not Present- Back Pain, Joint Pain, Joint Stiffness, Muscle Pain and Swelling of Extremities. Neurological Present- Headaches, Numbness and Tingling. Not Present- Decreased Memory, Fainting, Seizures, Tremor, Trouble walking and Weakness. Psychiatric Present- Anxiety and Depression. Not Present- Bipolar, Change in Sleep Pattern, Fearful and Frequent crying. Endocrine Present- Excessive Hunger, Heat Intolerance and Hot flashes. Not Present- Cold Intolerance,  Hair Changes and New Diabetes. Hematology Present- Easy Bruising, Excessive bleeding and Persistent Infections. Not Present- Gland problems and HIV.  Vitals Rachel Mcdonald Dockery LPN; 1/61/0960 45:40 AM) 08/23/2015 11:30 AM Weight: 256.6 lb Height: 64.5in Body Surface Area: 2.19 m Body Mass Index: 43.36 kg/m  Temp.: 98.17F(Oral)  Pulse: 90 (Regular)  BP: 122/86 (Sitting, Left Arm, Standard)      Physical Exam Rachel Levee MD; 08/23/2015 12:16 PM)  General Mental Status-Alert. General Appearance-Not in acute distress. Build & Nutrition-Well nourished. Posture-Normal posture. Gait-Normal.  Head and Neck Head-normocephalic, atraumatic with no lesions or palpable masses. Trachea-midline.  Chest and Lung Exam Chest and lung exam reveals -on auscultation, normal breath sounds, no adventitious sounds and normal vocal resonance.  Cardiovascular Cardiovascular examination reveals -normal heart sounds, regular rate and rhythm with no murmurs.  Abdomen Inspection Inspection of the abdomen reveals - No Hernias. Palpation/Percussion Palpation and Percussion of the abdomen reveal - Soft, Non Tender, No Rigidity (guarding), No hepatosplenomegaly and No Palpable abdominal masses.  Rectal Anorectal Exam External - warts (right and left lateral). Internal - normal internal exam.  Neurologic Neurologic evaluation reveals -alert and oriented x 3 with no  impairment of recent or remote memory, normal attention span and ability to concentrate, normal sensation and normal coordination.  Musculoskeletal Normal Exam - Bilateral-Upper Extremity Strength Normal and Lower Extremity Strength Normal.   Results Rachel Levee MD; 08/23/2015 12:19 PM) Procedures  Name Value Date ANOSCOPY, DIAGNOSTIC (98119) [ Hemorrhoids ] Procedure Other: Procedure: Anoscopy Surgeon: Maisie Fus After the risks and benefits were explained, verbal consent was obtained for above procedure. A medical assistant chaperone was present thoroughout the entire procedure. Anesthesia: none Diagnosis: anal condyloma Findings: No internal condyloma noted. No major internal hemorrhoid disease.  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 She would like to proceed with excision and laser ablation. All questions were answered. We discussed the management of anal warts. We discussed chemical destruction, immunotherapy, and surgical excision. I discussed the pros and cons of each approach. We discussed the risk and benefits and the expected outcome with chemical destruction with agents such as podophyllin. I explained that podophyllin is generally not been effective and has a high recurrence rate. We discussed the use of Aldara ointment. I explained that it has a 30-70% chance at resolving or at least reducing the number of anal warts. I explained that it is applied 3 times a week at night and left on overnight. I explained that skin irritation is the most common side effect. We then discussed surgical excision specifically excision and fulguration. I explained how the surgery is performed. I explained that it can be painful however it generally has the highest success rate. We discussed the risk and benefits of surgery including  but not limited to bleeding, infection, injury to surrounding structures, need to do a formal anoscopic exam to evaluate for anal canal warts, urinary retention, wart recurrence, and general anesthesia risk. We discussed the typical aftercare.

## 2015-09-10 ENCOUNTER — Encounter (HOSPITAL_COMMUNITY): Payer: Self-pay | Admitting: Emergency Medicine

## 2015-09-10 ENCOUNTER — Emergency Department (HOSPITAL_COMMUNITY)
Admission: EM | Admit: 2015-09-10 | Discharge: 2015-09-11 | Disposition: A | Discharge status: Home / Self Care | Payer: Medicaid Other | Attending: Emergency Medicine | Admitting: Emergency Medicine

## 2015-09-10 DIAGNOSIS — Z79899 Other long term (current) drug therapy: Secondary | ICD-10-CM | POA: Insufficient documentation

## 2015-09-10 DIAGNOSIS — F17210 Nicotine dependence, cigarettes, uncomplicated: Secondary | ICD-10-CM | POA: Insufficient documentation

## 2015-09-10 DIAGNOSIS — Y9289 Other specified places as the place of occurrence of the external cause: Secondary | ICD-10-CM | POA: Diagnosis not present

## 2015-09-10 DIAGNOSIS — Z3202 Encounter for pregnancy test, result negative: Secondary | ICD-10-CM | POA: Diagnosis not present

## 2015-09-10 DIAGNOSIS — Y998 Other external cause status: Secondary | ICD-10-CM | POA: Diagnosis not present

## 2015-09-10 DIAGNOSIS — T401X1A Poisoning by heroin, accidental (unintentional), initial encounter: Secondary | ICD-10-CM | POA: Diagnosis not present

## 2015-09-10 DIAGNOSIS — Z8679 Personal history of other diseases of the circulatory system: Secondary | ICD-10-CM | POA: Insufficient documentation

## 2015-09-10 DIAGNOSIS — Y9389 Activity, other specified: Secondary | ICD-10-CM | POA: Insufficient documentation

## 2015-09-10 DIAGNOSIS — T50901A Poisoning by unspecified drugs, medicaments and biological substances, accidental (unintentional), initial encounter: Secondary | ICD-10-CM

## 2015-09-10 DIAGNOSIS — T405X1A Poisoning by cocaine, accidental (unintentional), initial encounter: Secondary | ICD-10-CM | POA: Insufficient documentation

## 2015-09-10 DIAGNOSIS — Z8619 Personal history of other infectious and parasitic diseases: Secondary | ICD-10-CM | POA: Insufficient documentation

## 2015-09-10 LAB — CBC
HCT: 40.2 % (ref 36.0–46.0)
Hemoglobin: 13.2 g/dL (ref 12.0–15.0)
MCH: 34.3 pg — ABNORMAL HIGH (ref 26.0–34.0)
MCHC: 32.8 g/dL (ref 30.0–36.0)
MCV: 104.4 fL — AB (ref 78.0–100.0)
PLATELETS: 154 10*3/uL (ref 150–400)
RBC: 3.85 MIL/uL — AB (ref 3.87–5.11)
RDW: 15 % (ref 11.5–15.5)
WBC: 14 10*3/uL — ABNORMAL HIGH (ref 4.0–10.5)

## 2015-09-10 LAB — COMPREHENSIVE METABOLIC PANEL
ALBUMIN: 4.1 g/dL (ref 3.5–5.0)
ALK PHOS: 76 U/L (ref 38–126)
ALT: 101 U/L — AB (ref 14–54)
ANION GAP: 11 (ref 5–15)
AST: 64 U/L — ABNORMAL HIGH (ref 15–41)
BUN: 7 mg/dL (ref 6–20)
CALCIUM: 10.8 mg/dL — AB (ref 8.9–10.3)
CO2: 18 mmol/L — ABNORMAL LOW (ref 22–32)
CREATININE: 0.75 mg/dL (ref 0.44–1.00)
Chloride: 107 mmol/L (ref 101–111)
GFR calc Af Amer: >60 mL/min (ref >60)
GFR calc non Af Amer: >60 mL/min (ref >60)
GLUCOSE: 178 mg/dL — AB (ref 65–99)
Potassium: 4 mmol/L (ref 3.5–5.1)
SODIUM: 136 mmol/L (ref 135–145)
Total Bilirubin: 0.5 mg/dL (ref 0.3–1.2)
Total Protein: 7.9 g/dL (ref 6.5–8.1)

## 2015-09-10 LAB — ACETAMINOPHEN LEVEL: Acetaminophen (Tylenol), Serum: <10 ug/mL — ABNORMAL LOW (ref 10–30)

## 2015-09-10 LAB — I-STAT BETA HCG BLOOD, ED (MC, WL, AP ONLY)

## 2015-09-10 LAB — CBG MONITORING, ED: Glucose-Capillary: 144 mg/dL — ABNORMAL HIGH (ref 65–99)

## 2015-09-10 LAB — ETHANOL: Alcohol, Ethyl (B): 25 mg/dL — ABNORMAL HIGH (ref <5)

## 2015-09-10 LAB — SALICYLATE LEVEL

## 2015-09-10 NOTE — ED Notes (Signed)
Pt's contact:  Asher MuirJamie (friend)--- tel# [phone];  Tiffany(friend)---- tel# [phone]

## 2015-09-10 NOTE — ED Notes (Signed)
Bed: [id number] Expected date:  Expected time:  Means of arrival:  Comments: overdose

## 2015-09-10 NOTE — Discharge Instructions (Signed)
Please stop using illicit drugs.  These are harmful to your body and can cause serious damage.   May follow-up with outpatient clinics if you would like some help getting clean. Return to the ED for new or worsening symptoms.  Community Resource Guide Outpatient Counseling/Substance Abuse Adult The United Ways 211 is a great source of information about community services available.  Access by dialing 2-1-1 from anywhere in [place], or by website -  PooledIncome.pl.   Other Local Resources (Updated 05/2015)  Crisis Hotlines   Services     Area Served  [employer]  Crisis Hotline, available 24 hours a day, 7 days a week: 531-522-3832 [facility], [place]   [facility]  Crisis Hotline, available 24 hours a day, 7 days a week: [phone] [facility], [place]  [facility]  Suicide Prevention Hotline, available 24 hours a day, 7 days a week: (914)433-1656 [facility], [place]  [employer], available 24 hours a day, 7 days a week: (820)218-2308 [facility], [place]   [facility] Access to [employer], available 24 hours a day, 7 days a week: 573-882-8452 All   [facility]  Crisis Hotline, available 24 hours a day, 7 days a week: 301-291-8260 All   Other Local Resources (Updated 05/2015)  Outpatient Counseling/ Substance Abuse Programs  Services     Address and Phone Number  ADS (Alcohol and Drug Services)   Options include Individual counseling, group counseling, intensive outpatient program (several hours a day, several days a week)  Offers depression assessments  Provides methadone maintenance program [phone] [address], [address] [place], [place] [address]   [facility]   Offers partial hospitalization/day treatment and DUI/DWI programs  [employer], private insurance [phone] [address], [address] [place], [place] [address]  [facility]     Services include intensive outpatient program (several hours a day, several days a week), outpatient treatment, DUI/DWI services, family education  Also has some services specifically for [employer] transitional housing  [phone] [address] [place], [place] [address]     [facility]  [employer], private pay, and private insurance [phone] [address], [address] [place], [place] [address]  [employer] of Care  Services include individual counseling, substance abuse intensive outpatient program (several hours a day, several days a week), day treatment  Delene Loll, Medicaid, private insurance [phone] [address], [address] [place], [place] [address]  [facility]   Offers substance abuse intensive outpatient program (several hours a day, several days a week), partial hospitalization program [phone] [address] [place], [place] [address]  [phone] [address] [place], [place] [address]  [phone] [address] [place], [place] [address]  [phone], [address] [place], [place] [address]  [facility]  Individual counseling only  Accepts private insurance only [phone] [address] [place], [place] [address]  [facility]  Methadone maintenance program [phone] [address] [place], [place] [address]  [facility]  Walk-In Clinic providing substance abuse and mental health counseling  Accepts Medicaid, Medicare, private insurance  Offers sliding scale for uninsured [phone] [address] [place], [place]   [facility] [place], [employer].  Offers individual counseling, and intensive in-home services [phone] [address], [address] [place], [place] [address]  [facility] individual counseling, family counseling, group therapy, domestic  violence  counseling, consumer credit counseling  Accepts Medicare, Medicaid, private insurance  Offers sliding scale for uninsured [phone] [address] [place], [place] [address]  [phone] [facility], [address] [place], [place] [address]  [facility]  Offers individual, family and group counseling  3 locations - [place], [place], and [place]  [phone]  [address] [place], [place] [address]  [address] [place], [place] [address]  [address] [place], [place] [address]  [facility]    Offers psychiatric assessment, 8-week Intensive Outpatient Program (several hours a day, several times a week, daytime or evenings), early recovery group, family Program, medication management  Private pay or private insurance only [phone], or  [phone] [address] [place], [place] [address]  [facility]  Offers individual, couples and family counseling  Accepts Medicaid, private insurance, and sliding scale for uninsured [phone] [address] [place], [place] [address]  Len Blalockavid Fuller, MD  Individual counseling  Private insurance [phone] [address] [place], [place] [address]  [facility]   Offers assessment, substance abuse treatment, and behavioral health treatment [phone] [address] [place], [place] [address]  [facility]  Individual counseling  Accepts private insurance [phone] [address] [place], [place] [address]  [facility]  Individual counseling  Delene Lollccepts Medicare, private insurance [phone] [address] [place], [place] [address]  [facility]    Offers intensive outpatient program (several hours a day, several times a week)  Private pay, private insurance [phone] [facility] [address] [facility] [place], [place]  [facility]  Individual counseling  Medicare, private  insurance [phone] [address], [address] [place], [place] [address]  [facility]    Offers intensive outpatient program (several hours a day, several times a week) and partial hospitalization program [phone] [address] [place], [place] [address]  Emerson MonteParrish McKinney, MD  Individual counseling [phone] [address], [address] [place], [place] [address]  [facility]  Offers Christian counseling to individuals, couples, and families  Accepts Medicare and private insurance; offers sliding scale for uninsured [phone] [address] [place], [place] [address]  [facility]  [facility] counseling [phone] [address], [address] [place], [place] [address]  [facility]   Offers crisis counseling, individual counseling, group therapy, in-home therapy, domestic violence services, day treatment, DWI services, Administrator, artsCommunity Support Team (CST), Assertive Community Treatment Team (ACTT), substance abuse Intensive Outpatient Program (several hours a day, several times a week)  2 locations - [place] and [place] [phone] [address] [place], [place] [address]  [phone] [address] [place], [place] [address]  [facility]     Individual counseling and group therapy  Accepts private insurance, Blanch[place]Medicare, [place]icaid [phone] [address] [place], [place]  [facility]  Offers individual and family counseling  Offers LGBTQ services  Accepts private insurance and private pay [phone] [address] [place], [place] [address]  [facility]    Offers individual counseling, group therapy, and outpatient detox  Accepts private insurance [phone] [address] [place], [place]  [facility]  Individual counseling  Accepts Medicare, private insurance [phone] [address],  [address] [place], [place] [address]  [facility]  Individual counseling  Accepts Medicare, private insurance [phone] [address] [place], [place] [address]  [facility]phan[facility]   Offers substance abuse Intensive Outpatient Program (several hours a day, several times a week)  [phone], or [phone] [place], [place]   [employer] Guide Inpatient Behavioral Health/Residential  Substance Abuse Treatment Adults The United Ways 211 is a great source of information about community services available.  Access by dialing 2-1-1 from anywhere in [place], or by website -  PooledIncome.pl.   (Updated 05/2015)  Crisis Assistance 24 hours a day   Services Offered    Area [employer]  24-hour crisis assistance: [phone] [place], [place]   [facility]  24-hour crisis assistance:[phone] [place], [place]  [place]   24-hour crisis assistance: [phone] [place], [place]   [facility] Access to Care Line  24-hour crisis assistance; 7241566190 All   [facility]  24-hour crisis response line: [phone] All   Other Local Resources (Updated 05/2015)  Inpatient Behavioral Health/Residential Substance Abuse Treatment Programs   Services      Address and Phone Number  [facility] ([facility])   14-day residential rehabilitation  [phone] [address] [place], [place]  [facility] ([facility])    Detox - private pay only  14-day residential rehabilitation -  Medicaid, insurance, private pay only [phone], or [phone] [address], [place], [place] [address]   [facility] [employer] only  Multiple facilities [phone] admissions   [facility])   90-day program  Must be homeless to participate  [phone], or [phone] Marcy Panning, [facility]  [facility] only [phone], or  [phone] [address] [place], [place] [address]  [facility]     Must make an appointment  Transportation is offered from [place] on [employer].  Accepts private pay, Sheryn Bison [facility] [phone]  [address] [place], [place] [address]   [employer]  Females only  Associated with the [facility] [phone] [address] [place], [place] [address]  [facility] only [phone], or [phone] [address] [place], [place]  [facility]    Detox  Residential rehabilitation  Private insurance only  Multiple locations [phone] admissions  [facility]    Private pay  Private insurance [phone] [address] [place], [place] [address]  [facility]    Males only  Fee required at time of admission [phone] [address] [place], [place] [address]  [facility]    Private pay only  [phone] [address] [place], [place]  RTS (Residential Treatment Services)    Detox - private pay, Medicaid  Residential rehabilitation for males  - Medicare, Medicaid, insurance, private pay [phone] [address] [place], [place]   [facility]    Walk-in interviews Monday - Saturday from 8 am - 4 pm  Individuals with legal charges are not eligible [phone] [address] [place], [place] [address]  [facility]   Must be willing to work  Must attend Alcoholics Anonymous meetings [phone] [address] [place], [place]   [facility] [employer]    Faith-based program  Private pay only [phone] [address] [place], [place]

## 2015-09-10 NOTE — ED Notes (Signed)
Pt denies SI/HI 

## 2015-09-10 NOTE — ED Provider Notes (Signed)
CSN: [id number]     Arrival date & time 09/10/15  2151 History   First MD Initiated Contact with Patient 09/10/15 2215     Chief Complaint  Patient presents with  . Drug Overdose     (Consider location/radiation/quality/duration/timing/severity/associated sxs/prior Treatment) Patient is a 35 y.o. female presenting with Overdose. The history is provided by the patient and medical records.  Drug Overdose   35 year old female with history of hepatitis C and migraine headaches, presenting to the ED after a drug overdose.  Patient reports she has been drinking alcohol tonight with friends and used a "small amount" of heroin and cocaine. She reports she snorted both substances. Her friends noticed that she was not responsive and "looked blue" so called her neighbor who came over and found patient unresponsive in a chair. She called EMS who gave her Narcan with increased responsiveness. On arrival to the ED patient is awake, alert, oriented to baseline. She is able to provide history.  She does appear drowsy currently. She complains of nausea and a headache. She states she does occasionally use drugs, not daily. She denies daily alcohol use. She reports her ingestion today was not an attempt to harm herself in any way. She denies any suicidal or homicidal ideation. No hallucinations. No psychiatric history. Vital signs are stable on arrival.  Past Medical History  Diagnosis Date  . Hepatitis C   . Migraines    Past Surgical History  Procedure Laterality Date  . Splenectomy, total    . Chest tube placement    . C sections    . Dilation and curettage of uterus    . Tubal ligation     Family History  Problem Relation Age of Onset  . Diabetes Other    Social History  Substance Use Topics  . Smoking status: Current Every Day Smoker -- 1.00 packs/day    Types: Cigarettes  . Smokeless tobacco: None  . Alcohol Use: No   OB History    No data available     Review of Systems   Psychiatric/Behavioral:       Overdose  All other systems reviewed and are negative.     Allergies  Review of patient's allergies indicates no known allergies.  Home Medications   Prior to Admission medications   Medication Sig Start Date End Date Taking? Authorizing Provider  amphetamine-dextroamphetamine (ADDERALL) 30 MG tablet Take 30 mg by mouth 2 (two) times daily.    Historical Provider, MD  benzoyl peroxide (BENZOYL PEROXIDE) 5 % external liquid Apply topically 2 (two) times daily. Patient not taking: Reported on 09/10/2014 08/31/14   Trixie Dredge, PA-C  chlorhexidine (HIBICLENS) 4 % external liquid Apply topically daily as needed. Patient not taking: Reported on 03/15/2015 08/31/14   Trixie Dredge, PA-C  FLUoxetine (PROZAC) 20 MG capsule Take 40 mg by mouth at bedtime.    Historical Provider, MD  ibuprofen (ADVIL,MOTRIN) 600 MG tablet Take 1 tablet (600 mg total) by mouth every 6 (six) hours as needed. 03/15/15   Tatyana Kirichenko, PA-C  oxyCODONE-acetaminophen (PERCOCET) 5-325 MG tablet Take 1 tablet by mouth every 4 (four) hours as needed for severe pain. 03/15/15   Tatyana Kirichenko, PA-C  promethazine (PHENERGAN) 25 MG tablet Take 1 tablet (25 mg total) by mouth every 6 (six) hours as needed for nausea or vomiting. 03/15/15   Tatyana Kirichenko, PA-C  tamsulosin (FLOMAX) 0.4 MG CAPS capsule Take 1 capsule (0.4 mg total) by mouth daily. 03/15/15   Jaynie Crumble, PA-C  BP 102/60 mmHg  Pulse 92  Temp(Src) 97.8 F (36.6 C) (Oral)  Resp 16  SpO2 99%   Physical Exam  Constitutional: She is oriented to person, place, and time. She appears well-developed and well-nourished.  HENT:  Head: Normocephalic and atraumatic.  Mouth/Throat: Oropharynx is clear and moist.  Eyes: Conjunctivae and EOM are normal. Pupils are equal, round, and reactive to light.  Neck: Normal range of motion.  Cardiovascular: Normal rate, regular rhythm and normal heart sounds.   Pulmonary/Chest: Effort  normal and breath sounds normal.  Abdominal: Soft. Bowel sounds are normal.  Musculoskeletal: Normal range of motion.  Track marks noted in bilateral AC's and right upper arm; some small areas of bruising noted  Neurological: She is alert and oriented to person, place, and time. She has normal strength. She displays no tremor. No cranial nerve deficit or sensory deficit. She displays no seizure activity.  Appears drowsy but AAOx3; moving all extremities well  Skin: Skin is warm and dry.  Psychiatric: She has a normal mood and affect. She is not actively hallucinating. She expresses no homicidal and no suicidal ideation. She expresses no suicidal plans and no homicidal plans.  Denies SI/HI/AVH  Nursing note and vitals reviewed.   ED Course  Procedures (including critical care time) Labs Review Labs Reviewed  COMPREHENSIVE METABOLIC PANEL - Abnormal; Notable for the following:    CO2 18 (*)    Glucose, Bld 178 (*)    Calcium 10.8 (*)    AST 64 (*)    ALT 101 (*)    All other components within normal limits  ETHANOL - Abnormal; Notable for the following:    Alcohol, Ethyl (B) 25 (*)    All other components within normal limits  CBC - Abnormal; Notable for the following:    WBC 14.0 (*)    RBC 3.85 (*)    MCV 104.4 (*)    MCH 34.3 (*)    All other components within normal limits  ACETAMINOPHEN LEVEL - Abnormal; Notable for the following:    Acetaminophen (Tylenol), Serum <10 (*)    All other components within normal limits  CBG MONITORING, ED - Abnormal; Notable for the following:    Glucose-Capillary 144 (*)    All other components within normal limits  SALICYLATE LEVEL  URINE RAPID DRUG SCREEN, HOSP PERFORMED  I-STAT BETA HCG BLOOD, ED ([facility], [facility], [facility] ONLY)    Imaging Review No results found. I have personally reviewed and evaluated these images and lab results as part of my medical decision-making.   EKG Interpretation   Date/Time:  Friday Sep 10 2015 22:11:03  EDT Ventricular Rate:  88 PR Interval:  166 QRS Duration: 108 QT Interval:  380 QTC Calculation: 460 R Axis:   79 Text Interpretation:  Sinus rhythm No old tracing to compare Confirmed by  Ethelda ChickJACUBOWITZ  MD, SAM [phone]([id number]) on 09/10/2015 10:21:29 PM      MDM   Final diagnoses:  Drug overdose, accidental or unintentional, initial encounter   35 year old female here after a drug overdose. Neighbor was called this patient was unresponsive, EMS additionally called. She was given Narcan and became verbally responsive. On arrival to ED she appears drowsy but is awake, oriented to baseline, and able to provide history. She reports this was not a suicide attempt or any attempt to hurt herself in any way. She denies any suicidal or homicidal ideation. She reports using heroin, cocaine, drinking alcohol today. Lab work-pending.  Friends at bedside currently.  11:52 PM Patient has been observed in the ED for 2+ hours. She is no longer drowsy and is fully awake, alert, and oriented. She states she knows what she did was stupid. Her friends remain at bedside, report patient has back to baseline. Her lab work was reviewed and is overall reassuring-- ethanol 25. Patient continues to deny any suicidal or homicidal ideation. She has no known psychiatric history. Her AA sponsor will be meeting her at her home. Her friends will also be staying with her this evening. Patient appears to have reasonable support system at home.  Feel she is stable for discharge. I had a discussion with patient regarding her drug use and risk of bodily harm to herself, she acknowledged understanding.  Will follow-up with her PCP.  She was given outpatient rehab services for her drug use if needed.  Discussed plan with patient, he/she acknowledged understanding and agreed with plan of care.  Return precautions given for new or worsening symptoms.  Garlon Hatchet, PA-C 09/11/15 1610  Doug Sou, MD 09/11/15 0120

## 2015-09-10 NOTE — Progress Notes (Signed)
Patient listed as having [place] access insurance without a pcp.  EDCM went to speak to patient at bedside, however patient is asleep.  [facility] is listed as her pcp on her Medicaid card.

## 2015-09-10 NOTE — ED Notes (Signed)
Brought in by EMS from home with c/o drug overdose.  Per EMS, pt's friend called EMS and reported that pt had "taken drugs and now not feeling well".   Pt was unresponsive and "not breathing and cyanotic" on EMS' arrival.  Pt was given Narcan 4 mg IV on scene---- pt started to breath on her own after narcan..  Pt arrived to ED on non-rebreather---- asleep but arousable to touch and voice.  Pt reports that she took "cocaine and heroine", unable to tell how much she took.

## 2015-09-16 ENCOUNTER — Encounter (HOSPITAL_BASED_OUTPATIENT_CLINIC_OR_DEPARTMENT_OTHER): Payer: Self-pay | Admitting: *Deleted

## 2015-09-20 ENCOUNTER — Encounter (HOSPITAL_BASED_OUTPATIENT_CLINIC_OR_DEPARTMENT_OTHER): Payer: Self-pay | Admitting: *Deleted

## 2015-09-20 NOTE — Progress Notes (Signed)
NPO AFTER MN.  ARRIVE AT 0600.  CURRENT LAB RESULTS IN CHART AND EPIC.  ASK MDA IF DRUG URINE TEST NEEDED DOS.  WILL DO SYMBICORT INHALER AND IF NEEDED TAKE HYDROCODONE AM DOS W/ SIPS OF WATER.

## 2015-09-23 ENCOUNTER — Ambulatory Visit (HOSPITAL_BASED_OUTPATIENT_CLINIC_OR_DEPARTMENT_OTHER): Payer: Medicaid Other | Admitting: Anesthesiology

## 2015-09-23 ENCOUNTER — Encounter (HOSPITAL_BASED_OUTPATIENT_CLINIC_OR_DEPARTMENT_OTHER)
Admission: RE | Disposition: A | Discharge status: Home / Self Care | Payer: Self-pay | Source: Ambulatory Visit | Attending: General Surgery

## 2015-09-23 ENCOUNTER — Encounter (HOSPITAL_BASED_OUTPATIENT_CLINIC_OR_DEPARTMENT_OTHER): Payer: Self-pay | Admitting: *Deleted

## 2015-09-23 ENCOUNTER — Ambulatory Visit (HOSPITAL_BASED_OUTPATIENT_CLINIC_OR_DEPARTMENT_OTHER)
Admission: RE | Admit: 2015-09-23 | Discharge: 2015-09-23 | Disposition: A | Discharge status: Home / Self Care | Payer: Medicaid Other | Source: Ambulatory Visit | Attending: General Surgery | Admitting: General Surgery

## 2015-09-23 DIAGNOSIS — A630 Anogenital (venereal) warts: Secondary | ICD-10-CM | POA: Diagnosis present

## 2015-09-23 DIAGNOSIS — F329 Major depressive disorder, single episode, unspecified: Secondary | ICD-10-CM | POA: Diagnosis not present

## 2015-09-23 DIAGNOSIS — J45909 Unspecified asthma, uncomplicated: Secondary | ICD-10-CM | POA: Diagnosis not present

## 2015-09-23 DIAGNOSIS — Z7951 Long term (current) use of inhaled steroids: Secondary | ICD-10-CM | POA: Insufficient documentation

## 2015-09-23 DIAGNOSIS — Z79899 Other long term (current) drug therapy: Secondary | ICD-10-CM | POA: Insufficient documentation

## 2015-09-23 DIAGNOSIS — F17200 Nicotine dependence, unspecified, uncomplicated: Secondary | ICD-10-CM | POA: Diagnosis not present

## 2015-09-23 HISTORY — DX: Other psychoactive substance abuse, in remission: F19.11

## 2015-09-23 HISTORY — DX: Other complications of anesthesia, initial encounter: T88.59XA

## 2015-09-23 HISTORY — DX: Immune thrombocytopenic purpura: D69.3

## 2015-09-23 HISTORY — DX: Personal history of other diseases of the digestive system: Z87.19

## 2015-09-23 HISTORY — DX: Calculus of ureter: N20.1

## 2015-09-23 HISTORY — DX: Adverse effect of unspecified anesthetic, initial encounter: T41.45XA

## 2015-09-23 HISTORY — PX: MINOR FULGERATION OF ANAL CONDYLOMA: SHX6467

## 2015-09-23 HISTORY — DX: Other specified personal risk factors, not elsewhere classified: Z91.89

## 2015-09-23 HISTORY — DX: Gastro-esophageal reflux disease without esophagitis: K21.9

## 2015-09-23 HISTORY — DX: Personal history of peptic ulcer disease: Z87.11

## 2015-09-23 HISTORY — DX: Calculus of kidney: N20.0

## 2015-09-23 SURGERY — EXAM UNDER ANESTHESIA
Anesthesia: Monitor Anesthesia Care

## 2015-09-23 MED ORDER — PROMETHAZINE HCL 25 MG/ML IJ SOLN
6.2500 mg | INTRAMUSCULAR | Status: DC | PRN
  Filled 2015-09-23: qty 1

## 2015-09-23 MED ORDER — PROPOFOL 10 MG/ML IV BOLUS
INTRAVENOUS | Status: AC
  Filled 2015-09-23: qty 20

## 2015-09-23 MED ORDER — ONDANSETRON HCL 4 MG/2ML IJ SOLN
INTRAMUSCULAR | Status: AC
  Filled 2015-09-23: qty 2

## 2015-09-23 MED ORDER — FENTANYL CITRATE (PF) 100 MCG/2ML IJ SOLN
INTRAMUSCULAR | Status: DC | PRN
  Administered 2015-09-23 (×4): 25 ug via INTRAVENOUS

## 2015-09-23 MED ORDER — KETAMINE HCL 10 MG/ML IJ SOLN
INTRAMUSCULAR | Status: AC
  Filled 2015-09-23: qty 1

## 2015-09-23 MED ORDER — HYDROMORPHONE HCL 1 MG/ML IJ SOLN
0.2500 mg | INTRAMUSCULAR | Status: DC | PRN
  Filled 2015-09-23: qty 1

## 2015-09-23 MED ORDER — ACETIC ACID 5 % SOLN
Status: DC | PRN
  Administered 2015-09-23: 1 via TOPICAL

## 2015-09-23 MED ORDER — LIDOCAINE 5 % EX OINT
TOPICAL_OINTMENT | CUTANEOUS | Status: DC | PRN
  Administered 2015-09-23: 1 via TOPICAL

## 2015-09-23 MED ORDER — HYDROCODONE-ACETAMINOPHEN 5-325 MG PO TABS: 1.0000 | ORAL_TABLET | Freq: Four times a day (QID) | ORAL | Status: DC | PRN

## 2015-09-23 MED ORDER — PROPOFOL 500 MG/50ML IV EMUL
INTRAVENOUS | Status: DC | PRN
  Administered 2015-09-23: 100 ug/kg/min via INTRAVENOUS

## 2015-09-23 MED ORDER — KETAMINE HCL 10 MG/ML IJ SOLN
INTRAMUSCULAR | Status: DC | PRN
  Administered 2015-09-23: 50 mg via INTRAVENOUS

## 2015-09-23 MED ORDER — BUPIVACAINE-EPINEPHRINE 0.5% -1:200000 IJ SOLN
INTRAMUSCULAR | Status: DC | PRN
  Administered 2015-09-23: 30 mL

## 2015-09-23 MED ORDER — MIDAZOLAM HCL 2 MG/2ML IJ SOLN
INTRAMUSCULAR | Status: AC
  Filled 2015-09-23: qty 4

## 2015-09-23 MED ORDER — FENTANYL CITRATE (PF) 100 MCG/2ML IJ SOLN
INTRAMUSCULAR | Status: AC
  Filled 2015-09-23: qty 2

## 2015-09-23 MED ORDER — MIDAZOLAM HCL 5 MG/5ML IJ SOLN
INTRAMUSCULAR | Status: DC | PRN
  Administered 2015-09-23 (×2): 1 mg via INTRAVENOUS
  Administered 2015-09-23: 2 mg via INTRAVENOUS

## 2015-09-23 MED ORDER — ONDANSETRON HCL 4 MG/2ML IJ SOLN
INTRAMUSCULAR | Status: DC | PRN
  Administered 2015-09-23: 4 mg via INTRAVENOUS

## 2015-09-23 MED ORDER — LACTATED RINGERS IV SOLN
INTRAVENOUS | Status: DC
  Administered 2015-09-23: 07:00:00 via INTRAVENOUS
  Filled 2015-09-23: qty 1000

## 2015-09-23 SURGICAL SUPPLY — 47 items
BENZOIN TINCTURE PRP APPL 2/3 (GAUZE/BANDAGES/DRESSINGS) ×3 IMPLANT
BLADE HEX COATED 2.75 (ELECTRODE) ×3 IMPLANT
BLADE SURG 10 STRL SS (BLADE) IMPLANT
BLADE SURG 15 STRL LF DISP TIS (BLADE) ×1 IMPLANT
BLADE SURG 15 STRL SS (BLADE) ×2
BRIEF STRETCH FOR OB PAD LRG (UNDERPADS AND DIAPERS) ×6 IMPLANT
CANISTER SUCTION 2500CC (MISCELLANEOUS) ×3 IMPLANT
COVER BACK TABLE 60X90IN (DRAPES) ×3 IMPLANT
COVER MAYO STAND STRL (DRAPES) ×3 IMPLANT
DECANTER SPIKE VIAL GLASS SM (MISCELLANEOUS) ×3 IMPLANT
DRAPE LAPAROTOMY 100X72 PEDS (DRAPES) IMPLANT
DRAPE LG THREE QUARTER DISP (DRAPES) IMPLANT
DRAPE UNDERBUTTOCKS STRL (DRAPE) IMPLANT
DRAPE UTILITY XL STRL (DRAPES) ×3 IMPLANT
DRSG PAD ABDOMINAL 8X10 ST (GAUZE/BANDAGES/DRESSINGS) ×3 IMPLANT
ELECT BLADE 6.5 .24CM SHAFT (ELECTRODE) IMPLANT
ELECT REM PT RETURN 9FT ADLT (ELECTROSURGICAL) ×3
ELECTRODE REM PT RTRN 9FT ADLT (ELECTROSURGICAL) ×1 IMPLANT
GAUZE SPONGE 4X4 16PLY XRAY LF (GAUZE/BANDAGES/DRESSINGS) ×3 IMPLANT
GLOVE BIO SURGEON STRL SZ 6.5 (GLOVE) ×2 IMPLANT
GLOVE BIO SURGEONS STRL SZ 6.5 (GLOVE) ×1
GLOVE INDICATOR 7.0 STRL GRN (GLOVE) ×3 IMPLANT
GOWN STRL REUS W/ TWL XL LVL3 (GOWN DISPOSABLE) ×2 IMPLANT
GOWN STRL REUS W/TWL XL LVL3 (GOWN DISPOSABLE) ×4
HEMOSTAT SURGICEL 2X14 (HEMOSTASIS) ×3 IMPLANT
KIT ROOM TURNOVER WOR (KITS) ×3 IMPLANT
LEGGING LITHOTOMY PAIR STRL (DRAPES) IMPLANT
LOOP VESSEL MAXI BLUE (MISCELLANEOUS) IMPLANT
NS IRRIG 500ML POUR BTL (IV SOLUTION) ×3 IMPLANT
PACK BASIN DAY SURGERY FS (CUSTOM PROCEDURE TRAY) ×3 IMPLANT
PAD ABD 8X10 STRL (GAUZE/BANDAGES/DRESSINGS) IMPLANT
PAD ARMBOARD 7.5X6 YLW CONV (MISCELLANEOUS) IMPLANT
PENCIL BUTTON HOLSTER BLD 10FT (ELECTRODE) ×3 IMPLANT
SPONGE GAUZE 4X4 12PLY (GAUZE/BANDAGES/DRESSINGS) IMPLANT
SPONGE GAUZE 4X4 12PLY STER LF (GAUZE/BANDAGES/DRESSINGS) ×3 IMPLANT
SPONGE SURGIFOAM ABS GEL 12-7 (HEMOSTASIS) IMPLANT
SUT CHROMIC 2 0 SH (SUTURE) IMPLANT
SUT CHROMIC 3 0 SH 27 (SUTURE) IMPLANT
SUT ETHIBOND 0 (SUTURE) IMPLANT
SUT VIC AB 2-0 SH 27 (SUTURE)
SUT VIC AB 2-0 SH 27XBRD (SUTURE) IMPLANT
SYR CONTROL 10ML LL (SYRINGE) ×3 IMPLANT
TOWEL OR 17X24 6PK STRL BLUE (TOWEL DISPOSABLE) ×6 IMPLANT
TRAY DSU PREP LF (CUSTOM PROCEDURE TRAY) ×3 IMPLANT
TUBE CONNECTING 12'X1/4 (SUCTIONS) ×1
TUBE CONNECTING 12X1/4 (SUCTIONS) ×2 IMPLANT
YANKAUER SUCT BULB TIP NO VENT (SUCTIONS) ×3 IMPLANT

## 2015-09-23 NOTE — Anesthesia Procedure Notes (Signed)
Procedure Name: MAC Date/Time: 09/23/2015 7:45 AM Performed by: Norva PavlovALLAWAY, Alois Colgan G Pre-anesthesia Checklist: Patient identified, Timeout performed, Emergency Drugs available, Suction available and Patient being monitored Oxygen Delivery Method: Nasal cannula Intubation Type: IV induction Placement Confirmation: positive ETCO2 and breath sounds checked- equal and bilateral

## 2015-09-23 NOTE — Discharge Instructions (Addendum)
Post Operative Instructions Following Laser Surgery  Laser treatment of condyloma (warts) is used to vaporize or eliminate the wart.  The laser actually creates a burn effect on the skin to accomplish this.  The following instructions will help in you comfort postoperatively:  First 24 h  Remove the dressing this evening after you return home from the hospital  Sit in a tub or sitz bath of COOL water for 20 minutes every hour while awake.  After the bath, carefully blot the area dry and place a piece of 100% Cotton with lidocaine ointment on it next to the anal opening.  You may sit on a covered ice pack between the baths as needed.    You should have crushed ice in small amounts until you are able to urinate.  After you urinate, you may drink fluids.  It is normal to not urinate for the first few hours after surgery.  If you become uncomfortable, call the office for instructions.   Take your pain medications as prescribed  You may eat whatever you feel like.  Start with a light meal and gradually advance your diet.    Beginning the day after your surgery  Sit in a tub of cool to warm water for 15 minutes at least 3 times a day and after bowel movements  After the bowel movement, clean with wet cotton, Tucks or baby wipes.  Apply lidocaine ointment to a thin piece of cotton and place over the anal opening after your baths.  This will collect any drainage or bleeding.  Drainage is usually a pink to tan color and is normal for the following 2-3 weeks after surgery.  Change to cotton ever 2-3 hours while awake.  Diet  Eat a regular diet.  Avoid foods that may constipate you or give you diarrhea.  Avoid foods with seeds, nuts, corn or popcorn.  Beginning the day after surgery, drink 6-8 glasses of water a day in addition to your meals.  Limit you caffeine intake to 1-2 servings per day.  Medication  Take a fiber supplement (Metamucil, Citrucel, FiberCon) twice a day.  Take a stool  softener like Colace twice daily  Take your pain medication as directed.  You may use Advil or Aleve with or instead of your prescribed pain medication to relieve mild discomfort.  Avoid products containing aspirin.  Do not take extra tylenol with your pain medication.  Bowel Habits  You should have a bowel movement at least every other day.  If you are constipated, you may take a Fleet enema or 2 Dulcolax tablets.  Call the office if no results occur.  You may bear down a normal amount to have a bowel movement without hurting your tissues after the operation.  Activity  Walking is encouraged.  You may drive when you are no longer on prescription pain medication.  You may go up and down stairs carefully.  No heavy lifting or strenuous activity until after your first post operative appointment  Do NOT sit on a rubber ring; instead use a soft pillow if needed.  Call the office if you have any questions or concerns.  Call IMMEDIATELY if you develop:  Rectal bleeding  Increasing rectal pain  Fever greater than 100 F  Difficulty urinating     Post Anesthesia Home Care Instructions  Activity: Get plenty of rest for the remainder of the day. A responsible adult should stay with you for 24 hours following the procedure.  For the next  24 hours, DO NOT: -Drive a car -Advertising copywriter -Drink alcoholic beverages -Take any medication unless instructed by your physician -Make any legal decisions or sign important papers.  Meals: Start with liquid foods such as gelatin or soup. Progress to regular foods as tolerated. Avoid greasy, spicy, heavy foods. If nausea and/or vomiting occur, drink only clear liquids until the nausea and/or vomiting subsides. Call your physician if vomiting continues.  Special Instructions/Symptoms: Your throat may feel dry or sore from the anesthesia or the breathing tube placed in your throat during surgery. If this causes discomfort, gargle with warm salt  water. The discomfort should disappear within 24 hours.  If you had a scopolamine patch placed behind your ear for the management of post- operative nausea and/or vomiting:  1. The medication in the patch is effective for 72 hours, after which it should be removed.  Wrap patch in a tissue and discard in the trash. Wash hands thoroughly with soap and water. 2. You may remove the patch earlier than 72 hours if you experience unpleasant side effects which may include dry mouth, dizziness or visual disturbances. 3. Avoid touching the patch. Wash your hands with soap and water after contact with the patch.

## 2015-09-23 NOTE — H&P (Signed)
Expand All Collapse All      History of Present Illness  Patient words: anal warts.  The patient is a 35 year old female who presents with anal lesions. 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 She states that the areas have bled on occasion and get inflamed and itch and burn sometimes as well. She has not tried any over-the-counter therapies. She is having regular bowel movements and denies any constipation or diarrhea. She denies any vaginal lesions. She has a history of Chlamydia and Trichomonas. Her most recent HIV test was negative.   Other Problems [name], LPN; 08/14/8117 14:78 AM) Alcohol Abuse Anxiety Disorder Asthma Back Pain Depression Gastric Ulcer Gastroesophageal Reflux Disease Hemorrhoids Hepatitis Hypercholesterolemia Kidney Stone Migraine Headache Transfusion history  Past Surgical History [name], LPN; 2/95/6213 08:65 AM) Cesarean Section - Multiple Liver Surgery Lung Surgery Left. Oral Surgery Splenectomy  Diagnostic Studies History [name], LPN; 7/84/6962 95:28 AM) Colonoscopy never Mammogram never Pap Smear 1-5 years ago  Allergies [name], LPN; 08/19/2438 10:27 AM) No Known Drug Allergies 08/23/2015  Medication History [name], LPN; 2/53/6644 03:47 AM) Adderall (  Tablet, Oral) Active. PROzac (  Capsule, Oral) Active. Symbicort (160-4.5MCG/ACT Aerosol, Inhalation) Active. Medications Reconciled  Social History [name], LPN; 08/30/9561 87:56 AM) Alcohol use Remotely quit alcohol use. Caffeine use Carbonated beverages, Coffee, Tea. Illicit drug use Remotely quit drug use. Tobacco use Current every day smoker.  Family History [name], LPN; 4/33/2951 88:41 AM) Alcohol Abuse Brother, Family Members In General, Father, Mother. Arthritis Family Members In General. Bleeding disorder  Mother. Breast Cancer Family Members In General. Depression Brother, Family Members In General, Father, Mother. Diabetes Mellitus Family Members In General, Father. Heart Disease Family Members In General. Ischemic Bowel Disease Family Members In General. Migraine Headache Brother, Family Members In General, Father. Respiratory Condition Family Members In General, Father. Seizure disorder Brother, Family Members In General.  Pregnancy / Birth History [name], LPN; 6/60/6301 60:10 AM) Age at menarche 8 years. Contraceptive History Depo-provera, Oral contraceptives. Gravida 6 Maternal age 67-20 Para 3 Regular periods     Review of Systems  General Present- Night Sweats, Weight Gain and Weight Loss. Not Present- Appetite Loss, Chills, Fatigue and Fever. Skin Present- Change in Wart/Mole, Dryness, Hives and Rash. Not Present- Jaundice, New Lesions, Non-Healing Wounds and Ulcer. HEENT Present- Seasonal Allergies. Not Present- Earache, Hearing Loss, Hoarseness, Nose Bleed, Oral Ulcers, Ringing in the Ears, Sinus Pain, Sore Throat, Visual Disturbances, Wears glasses/contact lenses and Yellow Eyes. Respiratory Present- Chronic Cough and Wheezing. Not Present- Bloody sputum, Difficulty Breathing and Snoring. Breast Not Present- Breast Mass, Breast Pain, Nipple Discharge and Skin Changes. Cardiovascular Present- Shortness of Breath. Not Present- Chest Pain, Difficulty Breathing Lying Down, Leg Cramps, Palpitations, Rapid Heart Rate and Swelling of Extremities. Gastrointestinal Present- Abdominal Pain, Bloating, Bloody Stool, Hemorrhoids and Nausea. Not Present- Change in Bowel Habits, Chronic diarrhea, Constipation, Difficulty Swallowing, Excessive gas, Gets full quickly at meals, Indigestion, Rectal Pain and Vomiting. Female Genitourinary Not Present- Frequency, Nocturia, Painful Urination, Pelvic Pain and Urgency. Musculoskeletal Present- Muscle Weakness. Not Present- Back  Pain, Joint Pain, Joint Stiffness, Muscle Pain and Swelling of Extremities. Neurological Present- Headaches, Numbness and Tingling. Not Present- Decreased Memory, Fainting, Seizures, Tremor, Trouble walking and Weakness. Psychiatric Present- Anxiety and Depression. Not Present- Bipolar, Change in Sleep Pattern, Fearful and Frequent crying. Endocrine Present- Excessive Hunger, Heat Intolerance and Hot flashes. Not Present- Cold Intolerance, Hair Changes  and New Diabetes. Hematology Present- Easy Bruising, Excessive bleeding and Persistent Infections. Not Present- Gland problems and HIV.   BP 131/80 mmHg  Pulse 80  Temp(Src) 98.3 F (36.8 C) (Oral)  Resp 16  Ht 5\' 4"  (1.626 m)  Wt 115.894 kg (255 lb 8 oz)  BMI 43.83 kg/m2  SpO2 99%  LMP 09/11/2015   Physical Exam   General Mental Status-Alert. General Appearance-Not in acute distress. Build & Nutrition-Well nourished. Posture-Normal posture. Gait-Normal.  Head and Neck Head-normocephalic, atraumatic with no lesions or palpable masses. Trachea-midline.  Chest and Lung Exam Chest and lung exam reveals -on auscultation, normal breath sounds, no adventitious sounds and normal vocal resonance.  Cardiovascular Cardiovascular examination reveals -normal heart sounds, regular rate and rhythm with no murmurs.  Abdomen Inspection Inspection of the abdomen reveals - No Hernias. Palpation/Percussion Palpation and Percussion of the abdomen reveal - Soft, Non Tender, No Rigidity (guarding), No hepatosplenomegaly and No Palpable abdominal masses.  Rectal Anorectal Exam External - warts (right and left lateral). Internal - normal internal exam.  Neurologic Neurologic evaluation reveals -alert and oriented x 3 with no impairment of recent or remote memory, normal attention span and ability to concentrate, normal sensation and normal coordination.  Musculoskeletal Normal Exam - Bilateral-Upper Extremity Strength  Normal and Lower Extremity Strength Normal.   Romie Levee(Anthon Harpole MD; 08/23/2015 12:19 PM) Procedures  Name Value Date ANOSCOPY, DIAGNOSTIC ([id number]) [ Hemorrhoids ] Procedure Other: Procedure: Anoscopy Surgeon: Maisie Fushomas After the risks and benefits were explained, verbal consent was obtained for above procedure. A medical assistant chaperone was present thoroughout the entire procedure. Anesthesia: none Diagnosis: anal condyloma Findings: No internal condyloma noted. No major internal hemorrhoid disease.  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 She would like to proceed with excision and laser ablation. All questions were answered. We discussed the management of anal warts. We discussed chemical destruction, immunotherapy, and surgical excision. I discussed the pros and cons of each approach. We discussed the risk and benefits and the expected outcome with chemical destruction with agents such as podophyllin. I explained that podophyllin is generally not been effective and has a high recurrence rate. We discussed the use of Aldara ointment. I explained that it has a 30-70% chance at resolving or at least reducing the number of anal warts. I explained that it is applied 3 times a week at night and left on overnight. I explained that skin irritation is the most common side effect. We then discussed surgical excision specifically excision and fulguration. I explained how the surgery is performed. I explained that it can be painful however it generally has the highest success rate. We discussed the risk and benefits of surgery including but not limited to bleeding, infection, injury to surrounding structures, need to do a formal anoscopic exam to evaluate for anal canal warts, urinary retention, wart recurrence, and general anesthesia risk. We discussed the typical aftercare.

## 2015-09-23 NOTE — Anesthesia Postprocedure Evaluation (Signed)
Anesthesia Post Note  Patient: Rachel Mcdonald, Rachel Mcdonald  Procedure(s) Performed: Procedure(s) (LRB): ANAL EXAM UNDER ANESTHESIA (N/A) LASER ABLATION OF CONDYLOMA (N/A)  Patient location during evaluation: PACU Anesthesia Type: General Level of consciousness: awake Pain management: pain level controlled Vital Signs Assessment: post-procedure vital signs reviewed and stable Respiratory status: spontaneous breathing Cardiovascular status: stable Anesthetic complications: no    Last Vitals:  Filed Vitals:   09/23/15 0628 09/23/15 0816  BP: 131/80 118/66  Pulse: 80 81  Temp: 36.8 C 36.4 C  Resp: 16 16    Last Pain:  Filed Vitals:   09/23/15 0821  PainSc: 0-No pain                 EDWARDS,Shamere Campas

## 2015-09-23 NOTE — Transfer of Care (Signed)
Immediate Anesthesia Transfer of Care Note  Patient: Rachel Mcdonald  Procedure(s) Performed: Procedure(s) (LRB): ANAL EXAM UNDER ANESTHESIA (N/A) LASER ABLATION OF CONDYLOMA (N/A)  Patient Location: PACU  Anesthesia Type: MAC  Level of Consciousness: awake, sedated, patient cooperative and responds to stimulation  Airway & Oxygen Therapy: Patient Spontanous Breathing and Patient connected to face mask oxygen  Post-op Assessment: Report given to PACU RN, Post -op Vital signs reviewed and stable and Patient moving all extremities  Post vital signs: Reviewed and stable  Complications: No apparent anesthesia complications

## 2015-09-23 NOTE — Op Note (Signed)
09/23/2015  8:12 AM  PATIENT:  Rachel Mcdonald  35 y.o. female  Patient Care Team: No Pcp Per Patient as PCP - General (General Practice)  PRE-OPERATIVE DIAGNOSIS:  Anal condyloma  POST-OPERATIVE DIAGNOSIS:  Anal condyloma  PROCEDURE:  Procedure(s): ANAL EXAM UNDER ANESTHESIA LASER ABLATION OF CONDYLOMA  SURGEON:  Surgeon(s): Romie LeveeAlicia Analeigha Nauman, MD  ASSISTANT: none   ANESTHESIA:   local  SPECIMEN:  Source of Specimen:  anal condyloma  DISPOSITION OF SPECIMEN:  PATHOLOGY  COUNTS:  YES  PLAN OF CARE: Discharge to home after PACU  PATIENT DISPOSITION:  PACU - hemodynamically stable.  INDICATION: 35 y.o. F with anal condyloma   OR FINDINGS: external anal condyloma  DESCRIPTION: the patient was identified in the preoperative holding area and taken to the OR where they were laid on the operating room table.  MAC anesthesia was induced without difficulty. The patient was then positioned in prone jackknife position with buttocks gently taped apart.  The patient was then prepped and draped in usual sterile fashion.  SCDs were noted to be in place prior to the initiation of anesthesia. A surgical timeout was performed indicating the correct patient, procedure, positioning and need for preoperative antibiotics.  A rectal block was performed using Marcaine with epinephrine.    I began with a digital rectal exam.  There were no lesions palpated.  After this was completed, a sponge was soaked in 5% acetic acid was placed over the perianal region. This was allowed to soak for 2 minutes.I then placed a Hill-Ferguson anoscope into the anal canal and evaluated this completely.  There were none seen even with acetic acid application.  There were multiple large lesions externally that were trimmed off with scissors.  Next the laser was brought onto the field.  The edges of the operative field were draped with wet towels.  Appropriate ventilation was obtained.  All staff were protected with small  particle masks and goggles safe for the laser.  The laser was then activated. All condylomatous lesions were ablated. The edges of the cut lesions were also treated.  Hemostasis was then achieved using electrocautery. A thin layer of Lidocaine ointment was then placed over the lesions. A sterile dressing was applied over this. The patient was then awakened from anesthesia and sent to the postanesthesia care unit in stable condition. All counts were correct operating room staff.

## 2015-09-23 NOTE — Anesthesia Preprocedure Evaluation (Addendum)
Anesthesia Evaluation  Patient identified by MRN, date of birth, ID band Patient awake    Reviewed: Allergy & Precautions, NPO status , Patient's Chart, lab work & pertinent test results  Airway Mallampati: II  TM Distance: >3 FB Neck ROM: Full    Dental   Pulmonary Current Smoker,    breath sounds clear to auscultation       Cardiovascular negative cardio ROS   Rhythm:Regular Rate:Normal     Neuro/Psych    GI/Hepatic GERD  ,(+) Hepatitis -  Endo/Other    Renal/GU Renal disease     Musculoskeletal   Abdominal   Peds  Hematology   Anesthesia Other Findings   Reproductive/Obstetrics                            Anesthesia Physical Anesthesia Plan  ASA: III  Anesthesia Plan: General   Post-op Pain Management:    Induction: Intravenous  Airway Management Planned: LMA  Additional Equipment:   Intra-op Plan:   Post-operative Plan: Extubation in OR  Informed Consent: I have reviewed the patients History and Physical, chart, labs and discussed the procedure including the risks, benefits and alternatives for the proposed anesthesia with the patient or authorized representative who has indicated his/her understanding and acceptance.   Dental advisory given  Plan Discussed with: CRNA and Anesthesiologist  Anesthesia Plan Comments:         Anesthesia Quick Evaluation

## 2015-09-24 ENCOUNTER — Encounter (HOSPITAL_BASED_OUTPATIENT_CLINIC_OR_DEPARTMENT_OTHER): Payer: Self-pay | Admitting: General Surgery

## 2015-11-02 ENCOUNTER — Other Ambulatory Visit: Payer: Medicaid Other

## 2015-11-02 DIAGNOSIS — B182 Chronic viral hepatitis C: Secondary | ICD-10-CM

## 2015-11-02 LAB — PROTIME-INR
INR: 0.9
PROTHROMBIN TIME: 9.9 s (ref 9.0–11.5)

## 2015-11-03 LAB — HEPATITIS B SURFACE ANTIGEN: HEP B S AG: NEGATIVE

## 2015-11-03 LAB — HEPATITIS B SURFACE ANTIBODY,QUALITATIVE: Hep B S Ab: NEGATIVE

## 2015-11-03 LAB — HIV ANTIBODY (ROUTINE TESTING W REFLEX): HIV: NONREACTIVE

## 2015-11-03 LAB — HEPATITIS A ANTIBODY, TOTAL: Hep A Total Ab: NONREACTIVE

## 2015-11-03 LAB — HEPATITIS B CORE ANTIBODY, TOTAL: Hep B Core Total Ab: NONREACTIVE

## 2015-11-03 LAB — AFP TUMOR MARKER: AFP-Tumor Marker: 4.8 ng/mL (ref <6.1)

## 2015-11-05 LAB — HCV RNA,LIPA RFLX NS5A DRUG RESIST

## 2015-11-06 LAB — LIVER FIBROSIS, FIBROTEST-ACTITEST
ALPHA-2-MACROGLOBULIN: 183 mg/dL (ref 106–279)
ALT: 55 U/L — ABNORMAL HIGH (ref 6–29)
APOLIPOPROTEIN A1: 125 mg/dL (ref 101–198)
BILIRUBIN: 0.2 mg/dL (ref 0.2–1.2)
FIBROSIS SCORE: 0.11
GGT: 43 U/L (ref 3–50)
HAPTOGLOBIN: 83 mg/dL (ref 43–212)
Necroinflammat ACT Score: 0.26
REFERENCE ID: 1565231

## 2015-11-10 ENCOUNTER — Telehealth: Payer: Self-pay

## 2015-11-10 NOTE — Telephone Encounter (Signed)
called patient for new patient appt to schedule - referral from [facility]

## 2015-11-17 ENCOUNTER — Encounter: Payer: Medicaid Other | Admitting: Internal Medicine

## 2015-12-29 ENCOUNTER — Encounter: Payer: Medicaid Other | Admitting: Internal Medicine

## 2016-02-18 ENCOUNTER — Ambulatory Visit
Admission: RE | Admit: 2016-02-18 | Discharge: 2016-02-18 | Disposition: A | Discharge status: Home / Self Care | Payer: Medicaid Other | Source: Ambulatory Visit | Attending: Family Medicine | Admitting: Family Medicine

## 2016-02-18 ENCOUNTER — Other Ambulatory Visit: Payer: Self-pay | Admitting: Family Medicine

## 2016-02-18 DIAGNOSIS — W19XXXA Unspecified fall, initial encounter: Secondary | ICD-10-CM

## 2016-02-18 DIAGNOSIS — R2689 Other abnormalities of gait and mobility: Secondary | ICD-10-CM

## 2016-04-10 ENCOUNTER — Encounter: Payer: Self-pay | Admitting: Obstetrics and Gynecology

## 2016-04-10 ENCOUNTER — Ambulatory Visit (INDEPENDENT_AMBULATORY_CARE_PROVIDER_SITE_OTHER): Payer: Medicaid Other | Admitting: Obstetrics and Gynecology

## 2016-04-10 VITALS — BP 123/76 | HR 92 | Ht 64.0 in | Wt 261.4 lb

## 2016-04-10 DIAGNOSIS — N938 Other specified abnormal uterine and vaginal bleeding: Secondary | ICD-10-CM

## 2016-04-10 NOTE — Progress Notes (Signed)
Patient did have US at Dr Pecola Leisureeese- she referred her for heavy bleeding.

## 2016-04-10 NOTE — Patient Instructions (Signed)
Endometrial Ablation Endometrial ablation removes the lining of the uterus (endometrium). It is usually a same-day, outpatient treatment. Ablation helps avoid major surgery, such as surgery to remove the cervix and uterus (hysterectomy). After endometrial ablation, you will have little or no menstrual bleeding and may not be able to have children. However, if you are premenopausal, you will need to use a reliable method of birth control following the procedure because of the small chance that pregnancy can occur. There are different reasons to have this procedure. These reasons include:  Heavy periods.  Bleeding that is causing anemia.  Irregular bleeding.  Bleeding fibroids on the lining inside the uterus if they are smaller than 3 centimeters. This procedure may not be possible for you if:   You want to have children in the future.   You have severe cramps with your menstrual period.   You have precancerous or cancerous cells in your uterus.   You were recently pregnant.   You have gone through menopause.   You have had major surgery on your uterus, resulting in thinning of the uterine wall. Surgeries may include:  The removal of one or more uterine fibroids (myomectomy).  A cesarean section with a classic (vertical) incision on your uterus. Ask your health care provider what type of cesarean you had. Sometimes the scar on your skin is different than the scar on your uterus. Even if you have had surgery on your uterus, certain types of ablation may still be safe for you. Talk with your health care provider. LET YOUR HEALTH CARE PROVIDER KNOW ABOUT:  Any allergies you have.  All medicines you are taking, including vitamins, herbs, eye drops, creams, and over-the-counter medicines.  Previous problems you or members of your family have had with the use of anesthetics.  Any blood disorders you have.  Previous surgeries you have had.  Medical conditions you have. RISKS AND  COMPLICATIONS  Generally, this is a safe procedure. However, as with any procedure, complications can occur. Possible complications include:  Perforation of the uterus.  Bleeding.  Infection of the uterus, bladder, or vagina.  Injury to surrounding organs.  An air bubble to the lung (air embolus).  Pregnancy following the procedure.  Failure of the procedure to help the problem, requiring hysterectomy.  Decreased ability to diagnose cancer in the lining of the uterus. BEFORE THE PROCEDURE  The lining of the uterus must be tested to make sure there is no pre-cancerous or cancer cells present.  An ultrasound may be performed to look at the size of the uterus and to check for abnormalities.  Medicines may be given to thin the lining of the uterus. PROCEDURE  During the procedure, your health care provider will use a tool called a resectoscope to help see inside your uterus. There are different ways to remove the lining of your uterus.   Radiofrequency - This method uses a radiofrequency-alternating electric current to remove the lining of the uterus.  Cryotherapy - This method uses extreme cold to freeze the lining of the uterus.  Heated-Free Liquid - This method uses heated salt (saline) solution to remove the lining of the uterus.  Microwave - This method uses high-energy microwaves to heat up the lining of the uterus to remove it.  Thermal balloon - This method involves inserting a catheter with a balloon tip into the uterus. The balloon tip is filled with heated fluid to remove the lining of the uterus. AFTER THE PROCEDURE  After your procedure, do   not have sexual intercourse or insert anything into your vagina until permitted by your health care provider. After the procedure, you may experience:  Cramps.  Vaginal discharge.  Frequent urination. This information is not intended to replace advice given to you by your health care provider. Make sure you discuss any  questions you have with your health care provider. Document Released: 03/03/2004 Document Revised: 01/13/2015 Document Reviewed: 09/25/2012 [employer] Interactive Patient Education  2017 [employer]  
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
## 2016-04-11 ENCOUNTER — Other Ambulatory Visit: Payer: Self-pay | Admitting: Obstetrics and Gynecology

## 2016-04-20 ENCOUNTER — Other Ambulatory Visit: Payer: Self-pay | Admitting: Obstetrics and Gynecology

## 2016-04-20 ENCOUNTER — Encounter (HOSPITAL_COMMUNITY): Payer: Self-pay | Admitting: *Deleted

## 2016-05-23 NOTE — Patient Instructions (Signed)
Your procedure is scheduled on:  Tuesday, Jan. 30, 2018  [facility] at:  6:00 AM  Pick up the phone at the desk and dial [phone].  Call this number if you have problems the morning of surgery: [phone].  Remember: Do NOT eat food or drink after:  Midnight Monday, Jan. 29, 2018  Take these medicines the morning of surgery with a SIP OF WATER:  Clonazepam, Lamotrigine  Stop ALL herbal medications at this time  Do NOT smoke the day of surgery.  Do NOT wear jewelry (body piercing), metal hair clips/bobby pins, make-up, or nail polish. Do NOT wear lotions, powders, or perfumes.  You may wear deodorant. Do NOT shave for 48 hours prior to surgery. Do NOT bring valuables to the hospital. Contacts, dentures, or bridgework may not be worn into surgery.  Have a responsible adult drive you home and stay with you for 24 hours after your procedure  Bring a copy of your healthcare power of attorney and living will documents.  **Effective Friday, Jan. 12, 2018, [place] will implement no hospital visitations from children age 37 and younger due to a steady increase in flu activity in our community and hospitals. **

## 2016-05-23 NOTE — Pre-Procedure Instructions (Signed)
Advise patient not to take buprenorphine and naloxone the morning of surgery, per Dr. Mal AmabileMichael Foster.

## 2016-05-24 ENCOUNTER — Inpatient Hospital Stay (HOSPITAL_COMMUNITY)
Admission: RE | Admit: 2016-05-24 | Discharge: 2016-05-24 | Disposition: A | Discharge status: Home / Self Care | Payer: Medicaid Other | Source: Ambulatory Visit

## 2016-05-31 ENCOUNTER — Encounter (HOSPITAL_COMMUNITY)
Admission: RE | Admit: 2016-05-31 | Discharge: 2016-05-31 | Disposition: A | Discharge status: Home / Self Care | Payer: Medicaid Other | Source: Ambulatory Visit | Attending: Obstetrics and Gynecology | Admitting: Obstetrics and Gynecology

## 2016-05-31 ENCOUNTER — Encounter (HOSPITAL_COMMUNITY): Payer: Self-pay

## 2016-05-31 DIAGNOSIS — N938 Other specified abnormal uterine and vaginal bleeding: Secondary | ICD-10-CM | POA: Diagnosis not present

## 2016-05-31 DIAGNOSIS — Z01812 Encounter for preprocedural laboratory examination: Secondary | ICD-10-CM | POA: Diagnosis not present

## 2016-05-31 HISTORY — DX: Personal history of other medical treatment: Z92.89

## 2016-05-31 HISTORY — DX: Dyspnea, unspecified: R06.00

## 2016-05-31 HISTORY — DX: Asymptomatic varicose veins of unspecified lower extremity: I83.90

## 2016-05-31 HISTORY — DX: Other injury of unspecified body region, initial encounter: T14.8XXA

## 2016-05-31 HISTORY — DX: Complete or unspecified spontaneous abortion without complication: O03.9

## 2016-05-31 HISTORY — DX: Other specified soft tissue disorders: M79.89

## 2016-05-31 HISTORY — DX: Unspecified asthma, uncomplicated: J45.909

## 2016-05-31 HISTORY — DX: Pneumonia, unspecified organism: J18.9

## 2016-05-31 HISTORY — DX: Personal history of urinary calculi: Z87.442

## 2016-05-31 HISTORY — DX: Personal history of Methicillin resistant Staphylococcus aureus infection: Z86.14

## 2016-05-31 LAB — SURGICAL PCR SCREEN
MRSA, PCR: NEGATIVE
Staphylococcus aureus: NEGATIVE

## 2016-05-31 LAB — CBC
HEMATOCRIT: 39.5 % (ref 36.0–46.0)
HEMOGLOBIN: 12.9 g/dL (ref 12.0–15.0)
MCH: 32.5 pg (ref 26.0–34.0)
MCHC: 32.7 g/dL (ref 30.0–36.0)
MCV: 99.5 fL (ref 78.0–100.0)
Platelets: 94 10*3/uL — ABNORMAL LOW (ref 150–400)
RBC: 3.97 MIL/uL (ref 3.87–5.11)
RDW: 15.7 % — ABNORMAL HIGH (ref 11.5–15.5)
WBC: 6.8 10*3/uL (ref 4.0–10.5)

## 2016-05-31 NOTE — Pre-Procedure Instructions (Signed)
Rachel Mcdonald stated she was told by the physician that prescribed her buprenorphine to stop 24 hours prior to surgery and she wants to follow his recommendations at this time.

## 2016-05-31 NOTE — Patient Instructions (Signed)
Your procedure is scheduled on:  Tuesday, Jan. 30, 2018  [facility] at:  6:00 AM  Pick up the phone at the desk and dial [phone].  Call this number if you have problems the morning of surgery: [phone].  Remember: Do NOT eat food or drink after:  Midnight Monday  Take these medicines the morning of surgery with a SIP OF WATER:  None  Stop ALL herbal medications at this time  Do NOT smoke the day of surgery.  Do NOT wear jewelry (body piercing), metal hair clips/bobby pins, make-up, or nail polish. Do NOT wear lotions, powders, or perfumes.  You may wear deodorant. Do NOT shave for 48 hours prior to surgery. Do NOT bring valuables to the hospital. Contacts, dentures, or bridgework may not be worn into surgery.  Have a responsible adult drive you home and stay with you for 24 hours after your procedure  Bring a copy of your healthcare power of attorney and living will documents.  **Effective Friday, Jan. 12, 2018, [place] will implement no hospital visitations from children age 37 and younger due to a steady increase in flu activity in our community and hospitals. **

## 2016-05-31 NOTE — Pre-Procedure Instructions (Signed)
[facility] Dr. Jolayne Pantheronstant in reference to low platelet count at PAT visit today.

## 2016-06-01 ENCOUNTER — Inpatient Hospital Stay (HOSPITAL_COMMUNITY): Admission: RE | Admit: 2016-06-01 | Payer: Medicaid Other | Source: Ambulatory Visit

## 2016-06-01 DIAGNOSIS — N946 Dysmenorrhea, unspecified: Secondary | ICD-10-CM | POA: Diagnosis not present

## 2016-06-01 DIAGNOSIS — N939 Abnormal uterine and vaginal bleeding, unspecified: Secondary | ICD-10-CM | POA: Diagnosis present

## 2016-06-01 DIAGNOSIS — N938 Other specified abnormal uterine and vaginal bleeding: Secondary | ICD-10-CM | POA: Diagnosis not present

## 2016-06-01 DIAGNOSIS — Z79899 Other long term (current) drug therapy: Secondary | ICD-10-CM | POA: Diagnosis not present

## 2016-06-01 DIAGNOSIS — F17210 Nicotine dependence, cigarettes, uncomplicated: Secondary | ICD-10-CM | POA: Diagnosis not present

## 2016-06-01 DIAGNOSIS — N920 Excessive and frequent menstruation with regular cycle: Secondary | ICD-10-CM | POA: Diagnosis not present
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
## 2016-06-06 ENCOUNTER — Ambulatory Visit (HOSPITAL_COMMUNITY): Payer: Medicaid Other | Admitting: Anesthesiology

## 2016-06-06 ENCOUNTER — Ambulatory Visit (HOSPITAL_COMMUNITY)
Admission: RE | Admit: 2016-06-06 | Discharge: 2016-06-06 | Disposition: A | Discharge status: Home / Self Care | Payer: Medicaid Other | Source: Ambulatory Visit | Attending: Obstetrics and Gynecology | Admitting: Obstetrics and Gynecology

## 2016-06-06 ENCOUNTER — Encounter (HOSPITAL_COMMUNITY): Payer: Self-pay | Admitting: *Deleted

## 2016-06-06 ENCOUNTER — Encounter (HOSPITAL_COMMUNITY)
Admission: RE | Disposition: A | Discharge status: Home / Self Care | Payer: Self-pay | Source: Ambulatory Visit | Attending: Obstetrics and Gynecology

## 2016-06-06 DIAGNOSIS — F17210 Nicotine dependence, cigarettes, uncomplicated: Secondary | ICD-10-CM | POA: Insufficient documentation

## 2016-06-06 DIAGNOSIS — N920 Excessive and frequent menstruation with regular cycle: Secondary | ICD-10-CM | POA: Insufficient documentation

## 2016-06-06 DIAGNOSIS — N938 Other specified abnormal uterine and vaginal bleeding: Secondary | ICD-10-CM | POA: Insufficient documentation

## 2016-06-06 DIAGNOSIS — Z79899 Other long term (current) drug therapy: Secondary | ICD-10-CM | POA: Insufficient documentation

## 2016-06-06 DIAGNOSIS — N946 Dysmenorrhea, unspecified: Secondary | ICD-10-CM | POA: Insufficient documentation

## 2016-06-06 HISTORY — PX: HYSTEROSCOPY: SHX211

## 2016-06-06 LAB — CBC
HEMATOCRIT: 39.1 % (ref 36.0–46.0)
Hemoglobin: 12.8 g/dL (ref 12.0–15.0)
MCH: 33 pg (ref 26.0–34.0)
MCHC: 32.7 g/dL (ref 30.0–36.0)
MCV: 100.8 fL — AB (ref 78.0–100.0)
Platelets: 107 10*3/uL — ABNORMAL LOW (ref 150–400)
RBC: 3.88 MIL/uL (ref 3.87–5.11)
RDW: 15.8 % — AB (ref 11.5–15.5)
WBC: 8.6 10*3/uL (ref 4.0–10.5)

## 2016-06-06 LAB — RAPID URINE DRUG SCREEN, HOSP PERFORMED
Amphetamines: POSITIVE — AB
Barbiturates: NOT DETECTED
Benzodiazepines: NOT DETECTED
Cocaine: NOT DETECTED
Opiates: NOT DETECTED
Tetrahydrocannabinol: NOT DETECTED

## 2016-06-06 LAB — TYPE AND SCREEN
ABO/RH(D): O POS
Antibody Screen: POSITIVE

## 2016-06-06 LAB — PREGNANCY, URINE: Preg Test, Ur: NEGATIVE

## 2016-06-06 SURGERY — ABLATION, ENDOMETRIUM, HYSTEROSCOPIC
Anesthesia: General | Site: Vagina

## 2016-06-06 MED ORDER — SODIUM CHLORIDE 0.9 % IR SOLN
Status: DC | PRN
  Administered 2016-06-06: 3000 mL

## 2016-06-06 MED ORDER — MIDAZOLAM HCL 2 MG/2ML IJ SOLN
INTRAMUSCULAR | Status: AC
  Filled 2016-06-06: qty 2

## 2016-06-06 MED ORDER — CHLOROPROCAINE HCL 1 % IJ SOLN
INTRAMUSCULAR | Status: AC
  Filled 2016-06-06: qty 30

## 2016-06-06 MED ORDER — LIDOCAINE HCL (CARDIAC) 20 MG/ML IV SOLN
INTRAVENOUS | Status: AC
  Filled 2016-06-06: qty 5

## 2016-06-06 MED ORDER — SCOPOLAMINE 1 MG/3DAYS TD PT72
MEDICATED_PATCH | TRANSDERMAL | Status: AC
  Filled 2016-06-06: qty 1

## 2016-06-06 MED ORDER — FENTANYL CITRATE (PF) 100 MCG/2ML IJ SOLN
INTRAMUSCULAR | Status: DC | PRN
  Administered 2016-06-06: 100 ug via INTRAVENOUS

## 2016-06-06 MED ORDER — FENTANYL CITRATE (PF) 100 MCG/2ML IJ SOLN
INTRAMUSCULAR | Status: AC
  Filled 2016-06-06: qty 2

## 2016-06-06 MED ORDER — ONDANSETRON HCL 4 MG/2ML IJ SOLN
INTRAMUSCULAR | Status: DC | PRN
  Administered 2016-06-06: 4 mg via INTRAVENOUS

## 2016-06-06 MED ORDER — PROMETHAZINE HCL 25 MG/ML IJ SOLN: 6.2500 mg | INTRAMUSCULAR | Status: DC | PRN

## 2016-06-06 MED ORDER — PROPOFOL 10 MG/ML IV BOLUS
INTRAVENOUS | Status: AC
  Filled 2016-06-06: qty 20

## 2016-06-06 MED ORDER — MIDAZOLAM HCL 5 MG/5ML IJ SOLN
INTRAMUSCULAR | Status: DC | PRN
  Administered 2016-06-06: 2 mg via INTRAVENOUS

## 2016-06-06 MED ORDER — IBUPROFEN 600 MG PO TABS: 600.0000 mg | ORAL_TABLET | Freq: Four times a day (QID) | ORAL | 4 refills | Status: AC | PRN

## 2016-06-06 MED ORDER — DEXAMETHASONE SODIUM PHOSPHATE 10 MG/ML IJ SOLN
INTRAMUSCULAR | Status: DC | PRN
  Administered 2016-06-06: 10 mg via INTRAVENOUS

## 2016-06-06 MED ORDER — ALBUTEROL SULFATE HFA 108 (90 BASE) MCG/ACT IN AERS
INHALATION_SPRAY | RESPIRATORY_TRACT | Status: AC
  Filled 2016-06-06: qty 6.7

## 2016-06-06 MED ORDER — LIDOCAINE HCL (CARDIAC) 20 MG/ML IV SOLN
INTRAVENOUS | Status: DC | PRN
Start: 2016-06-06 — End: 2016-06-06
  Administered 2016-06-06: 80 mg via INTRAVENOUS

## 2016-06-06 MED ORDER — ONDANSETRON HCL 4 MG/2ML IJ SOLN
INTRAMUSCULAR | Status: AC
  Filled 2016-06-06: qty 2

## 2016-06-06 MED ORDER — OXYCODONE-ACETAMINOPHEN 5-325 MG PO TABS
ORAL_TABLET | ORAL | Status: AC
  Administered 2016-06-06: 1 via ORAL
  Filled 2016-06-06: qty 1

## 2016-06-06 MED ORDER — HYDROMORPHONE HCL 1 MG/ML IJ SOLN: 0.2500 mg | INTRAMUSCULAR | Status: DC | PRN

## 2016-06-06 MED ORDER — PROPOFOL 10 MG/ML IV BOLUS
INTRAVENOUS | Status: DC | PRN
  Administered 2016-06-06: 300 mg via INTRAVENOUS

## 2016-06-06 MED ORDER — CHLOROPROCAINE HCL 1 % IJ SOLN
INTRAMUSCULAR | Status: DC | PRN
  Administered 2016-06-06: 10 mL

## 2016-06-06 MED ORDER — OXYCODONE-ACETAMINOPHEN 5-325 MG PO TABS
1.0000 | ORAL_TABLET | ORAL | Status: DC | PRN
  Administered 2016-06-06: 1 via ORAL

## 2016-06-06 MED ORDER — DOCUSATE SODIUM 100 MG PO CAPS: 100.0000 mg | ORAL_CAPSULE | Freq: Two times a day (BID) | ORAL | 2 refills | Status: AC | PRN

## 2016-06-06 MED ORDER — SCOPOLAMINE 1 MG/3DAYS TD PT72
1.0000 | MEDICATED_PATCH | Freq: Once | TRANSDERMAL | Status: DC
  Administered 2016-06-06: 1.5 mg via TRANSDERMAL

## 2016-06-06 MED ORDER — LACTATED RINGERS IV SOLN
INTRAVENOUS | Status: DC
  Administered 2016-06-06 (×2): via INTRAVENOUS

## 2016-06-06 MED ORDER — OXYCODONE-ACETAMINOPHEN 5-325 MG PO TABS: 1.0000 | ORAL_TABLET | Freq: Four times a day (QID) | ORAL | 0 refills | Status: AC | PRN

## 2016-06-06 MED ORDER — DEXAMETHASONE SODIUM PHOSPHATE 10 MG/ML IJ SOLN
INTRAMUSCULAR | Status: AC
  Filled 2016-06-06: qty 1

## 2016-06-06 SURGICAL SUPPLY — 14 items
CATH ROBINSON RED A/P 16FR (CATHETERS) ×3 IMPLANT
CLOTH BEACON ORANGE TIMEOUT ST (SAFETY) ×3 IMPLANT
CONTAINER PREFILL 10% NBF 60ML (FORM) ×3 IMPLANT
GLOVE BIOGEL PI IND STRL 6.5 (GLOVE) ×1 IMPLANT
GLOVE BIOGEL PI IND STRL 7.0 (GLOVE) ×1 IMPLANT
GLOVE BIOGEL PI INDICATOR 6.5 (GLOVE) ×2
GLOVE BIOGEL PI INDICATOR 7.0 (GLOVE) ×2
GLOVE SURG SS PI 6.0 STRL IVOR (GLOVE) ×3 IMPLANT
GOWN STRL REUS W/TWL LRG LVL3 (GOWN DISPOSABLE) ×6 IMPLANT
PACK VAGINAL MINOR WOMEN LF (CUSTOM PROCEDURE TRAY) ×3 IMPLANT
PAD OB MATERNITY 4.3X12.25 (PERSONAL CARE ITEMS) ×3 IMPLANT
SET GENESYS HTA PROCERVA (MISCELLANEOUS) ×3 IMPLANT
TOWEL OR 17X24 6PK STRL BLUE (TOWEL DISPOSABLE) ×6 IMPLANT
WATER STERILE IRR 1000ML POUR (IV SOLUTION) ×3 IMPLANT

## 2016-06-06 NOTE — Op Note (Signed)
PREOPERATIVE DIAGNOSIS:  Abnormal uterine bleeding POSTOPERATIVE DIAGNOSIS: The same PROCEDURE: Hysteroscopy, Hydrothermal Endometrial Ablation SURGEON:  Dr. Catalina AntiguaPeggy Delona Clasby  INDICATIONS: 36 y.o. [id number] here for scheduled surgery for abnormal uterine bleeding. Risks of surgery were discussed with the patient including but not limited to: bleeding; infection which may require antibiotics; injury to uterus leading to risk of injury to surrounding intraperitoneal organs, burn injury to vagina or other organs, need for additional procedures including laparoscopy or laparotomy, inability to complete ablation due to uterine or mechanical anomaly, and other postoperative/anesthesia complications.  Patient was informed that there is a high likelihood of success of controlling her symptoms; however about 5% of patients may require further intervention.  Written informed consent was obtained.    FINDINGS:  An 8-week size uterus.  Diffuse proliferative endometrium.  Normal ostia bilaterally.  ANESTHESIA:   General, paracervical block. INTRAVENOUS FLUIDS:  1000 ml of LR ESTIMATED BLOOD LOSS:  20 ml SPECIMENS: none COMPLICATIONS:  None immediate.  PROCEDURE DETAILS:  The patient was then taken to the operating room where general anesthesia was administered and was found to be adequate.  After an adequate timeout was performed, she was placed in the dorsal lithotomy position and examined; then prepped and draped in the sterile manner.   Her bladder was catheterized for clear, yellow urine. A speculum was then placed in the patient's vagina and a single tooth tenaculum was applied to the anterior lip of the cervix.   A paracervical block using 10 ml of 0.5% Marcaine was administered.  The cervix was sounded to 8 cm and dilated manually with metal dilators to accommodate the hydrothermal ablation hysteroscopic apparatus.  Once the cervix was dilated, a sharp curettage was then performed to obtain a moderate amount of  endometrial curettings.  The hysteroscope was inserted under direct visualization using normal saline as a suspension medium.  The uterine cavity was carefully examined, both ostia were recognized, and diffusely proliferative endometrium was noted.   The hydrothermal ablation was then carried out as per protocol.   Complete ablation of the endometrium was observed and the hysteroscope was removed under direct visualization.  No complications were observed.  The tenaculum was removed from the anterior lip of the cervix, and the vaginal speculum was removed after noting good hemostasis.  The patient tolerated the procedure well and was taken to the recovery area awake, extubated and in stable condition.  The patient will be discharged to home as per PACU criteria.  Routine postoperative instructions given.  She was prescribed Percocet, Ibuprofen and Colace.

## 2016-06-06 NOTE — Anesthesia Preprocedure Evaluation (Addendum)
Anesthesia Evaluation  Patient identified by MRN, date of birth, ID band Patient awake    Reviewed: Allergy & Precautions, NPO status   Airway Mallampati: II  TM Distance: >3 FB     Dental   Pulmonary shortness of breath, asthma , pneumonia, Current Smoker,    breath sounds clear to auscultation       Cardiovascular + Peripheral Vascular Disease   Rhythm:Regular Rate:Normal     Neuro/Psych  Headaches,    GI/Hepatic GERD  ,(+) Hepatitis -  Endo/Other    Renal/GU Renal disease     Musculoskeletal   Abdominal   Peds  Hematology   Anesthesia Other Findings   Reproductive/Obstetrics                             Anesthesia Physical Anesthesia Plan  ASA: III  Anesthesia Plan: General   Post-op Pain Management:    Induction: Intravenous  Airway Management Planned: Oral ETT  Additional Equipment:   Intra-op Plan:   Post-operative Plan: Possible Post-op intubation/ventilation  Informed Consent: I have reviewed the patients History and Physical, chart, labs and discussed the procedure including the risks, benefits and alternatives for the proposed anesthesia with the patient or authorized representative who has indicated his/her understanding and acceptance.   Dental advisory given  Plan Discussed with: CRNA, Anesthesiologist and Surgeon  Anesthesia Plan Comments:         Anesthesia Quick Evaluation

## 2016-06-06 NOTE — Transfer of Care (Signed)
Immediate Anesthesia Transfer of Care Note  Patient: Rachel Mcdonald  Procedure(s) Performed: Procedure(s): HYSTEROSCOPY WITH HYDROTHERMAL ABLATION (N/A)  Patient Location: PACU  Anesthesia Type:General  Level of Consciousness: sedated  Airway & Oxygen Therapy: Patient Spontanous Breathing and Patient connected to nasal cannula oxygen  Post-op Assessment: Report given to RN and Post -op Vital signs reviewed and stable  Post vital signs: stable  Last Vitals:  Vitals:   06/06/16 0711 06/06/16 0839  BP: 126/78   Pulse: 96 86  Resp: 20   Temp: 36.8 C 36.6 C    Last Pain:  Vitals:   06/06/16 0711  PainSc: 3       Patients Stated Pain Goal: 3 (06/06/16 0711)  Complications: No apparent anesthesia complications

## 2016-06-06 NOTE — Anesthesia Postprocedure Evaluation (Signed)
Anesthesia Post Note  Patient: [name], [name]  Procedure(s) Performed: Procedure(s) (LRB): HYSTEROSCOPY WITH HYDROTHERMAL ABLATION (N/A)  Patient location during evaluation: PACU Anesthesia Type: General Level of consciousness: awake Pain management: pain level controlled Vital Signs Assessment: post-procedure vital signs reviewed and stable Respiratory status: spontaneous breathing Cardiovascular status: stable Anesthetic complications: no        Last Vitals:  Vitals:   06/06/16 0930 06/06/16 0945  BP: 117/67   Pulse: 88   Resp: 12 12  Temp:      Last Pain:  Vitals:   06/06/16 0855  PainSc: Asleep   Pain Goal: Patients Stated Pain Goal: 3 (06/06/16 0711)               Rachel Mcdonald

## 2016-06-06 NOTE — Discharge Instructions (Signed)
DISCHARGE INSTRUCTIONS: HYSTEROSCOPY / ENDOMETRIAL ABLATION The following instructions have been prepared to help you care for yourself upon your return home.  May Remove Scop patch on or before  May take Ibuprofen after  May take stool softner while taking narcotic pain medication to prevent constipation.  Drink plenty of water.  Personal hygiene:  Use sanitary pads for vaginal drainage, not tampons.  Shower the day after your procedure.  NO tub baths, pools or Jacuzzis for 2-3 weeks.  Wipe front to back after using the bathroom.  Activity and limitations:  Do NOT drive or operate any equipment for 24 hours. The effects of anesthesia are still present and drowsiness may result.  Do NOT rest in bed all day.  Walking is encouraged.  Walk up and down stairs slowly.  You may resume your normal activity in one to two days or as indicated by your physician. Sexual activity: NO intercourse for at least 2 weeks after the procedure, or as indicated by your Doctor.  Diet: Eat a light meal as desired this evening. You may resume your usual diet tomorrow.  Return to Work: You may resume your work activities in one to two days or as indicated by Therapist, sports.  What to expect after your surgery: Expect to have vaginal bleeding/discharge for 2-3 days and spotting for up to 10 days. It is not unusual to have soreness for up to 1-2 weeks. You may have a slight burning sensation when you urinate for the first day. Mild cramps may continue for a couple of days. You may have a regular period in 2-6 weeks.  Call your doctor for any of the following:  Excessive vaginal bleeding or clotting, saturating and changing one pad every hour.  Inability to urinate 6 hours after discharge from hospital.  Pain not relieved by pain medication.  Fever of 100.4 F or greater.  Unusual vaginal discharge or odor.  Return to office _________________Call for an appointment  ___________________ Patients signature: ______________________ Nurses signature ________________________  Post Anesthesia Care Unit [phone] Ablation Endometrial ablation removes the lining of the uterus (endometrium). It is usually a same-day, outpatient treatment. Ablation helps avoid major surgery, such as surgery to remove the cervix and uterus (hysterectomy). After endometrial ablation, you will have little or no menstrual bleeding and may not be able to have children. However, if you are premenopausal, you will need to use a reliable method of birth control following the procedure because of the small chance that pregnancy can occur. There are different reasons to have this procedure. These reasons include:  Heavy periods.  Bleeding that is causing anemia.  Irregular bleeding.  Bleeding fibroids on the lining inside the uterus if they are smaller than 3 centimeters. This procedure may not be possible for you if:   You want to have children in the future.   You have severe cramps with your menstrual period.   You have precancerous or cancerous cells in your uterus.   You were recently pregnant.   You have gone through menopause.   You have had major surgery on your uterus, resulting in thinning of the uterine wall. Surgeries may include:  The removal of one or more uterine fibroids (myomectomy).  A cesarean section with a classic (vertical) incision on your uterus. Ask your health care provider what type of cesarean you had. Sometimes the scar on your skin is different than the scar on your uterus. Even if you have had surgery on your uterus, certain types of  ablation may still be safe for you. Talk with your health care provider. LET [facility] PROVIDER KNOW ABOUT:  Any allergies you have.  All medicines you are taking, including vitamins, herbs, eye drops, creams, and over-the-counter medicines.  Previous problems you or members of your family  have had with the use of anesthetics.  Any blood disorders you have.  Previous surgeries you have had.  Medical conditions you have. RISKS AND COMPLICATIONS  Generally, this is a safe procedure. However, as with any procedure, complications can occur. Possible complications include:  Perforation of the uterus.  Bleeding.  Infection of the uterus, bladder, or vagina.  Injury to surrounding organs.  An air bubble to the lung (air embolus).  Pregnancy following the procedure.  Failure of the procedure to help the problem, requiring hysterectomy.  Decreased ability to diagnose cancer in the lining of the uterus. BEFORE THE PROCEDURE  The lining of the uterus must be tested to make sure there is no pre-cancerous or cancer cells present.  An ultrasound may be performed to look at the size of the uterus and to check for abnormalities.  Medicines may be given to thin the lining of the uterus. PROCEDURE  During the procedure, your health care provider will use a tool called a resectoscope to help see inside your uterus. There are different ways to remove the lining of your uterus.   Radiofrequency - This method uses a radiofrequency-alternating electric current to remove the lining of the uterus.  Cryotherapy - This method uses extreme cold to freeze the lining of the uterus.  Heated-Free Liquid - This method uses heated salt (saline) solution to remove the lining of the uterus.  Microwave - This method uses high-energy microwaves to heat up the lining of the uterus to remove it.  Thermal balloon - This method involves inserting a catheter with a balloon tip into the uterus. The balloon tip is filled with heated fluid to remove the lining of the uterus. AFTER THE PROCEDURE  After your procedure, do not have sexual intercourse or insert anything into your vagina until permitted by your health care provider. After the procedure, you may experience:  Cramps.  Vaginal  discharge.  Frequent urination. This information is not intended to replace advice given to you by your health care provider. Make sure you discuss any questions you have with your health care provider. Document Released: 03/03/2004 Document Revised: 01/13/2015 Document Reviewed: 09/25/2012 [employer]vier Interactive Patient Education  2017 [employer]

## 2016-06-06 NOTE — Anesthesia Procedure Notes (Signed)
Procedure Name: LMA Insertion Date/Time: 06/06/2016 8:00 AM Performed by: Fanny DanceMULLINS, Darthy Manganelli L Pre-anesthesia Checklist: Patient identified, Emergency Drugs available, Suction available and Patient being monitored Patient Re-evaluated:Patient Re-evaluated prior to inductionOxygen Delivery Method: Circle system utilized Preoxygenation: Pre-oxygenation with 100% oxygen Intubation Type: IV induction Ventilation: Mask ventilation without difficulty LMA: LMA inserted LMA Size: 4.0 Tube size: 4.0 mm Number of attempts: 1 Placement Confirmation: positive ETCO2 and breath sounds checked- equal and bilateral Tube secured with: Tape Dental Injury: Teeth and Oropharynx as per pre-operative assessment

## 2016-06-07 LAB — TYPE AND SCREEN
BLOOD PRODUCT EXPIRATION DATE: 201801312359
Unit Type and Rh: 5100

## 2016-06-08 ENCOUNTER — Encounter (HOSPITAL_COMMUNITY): Payer: Self-pay | Admitting: Obstetrics and Gynecology

## 2016-06-26 ENCOUNTER — Encounter: Payer: Medicaid Other | Admitting: Obstetrics and Gynecology

## 2017-05-18 IMAGING — CT CT RENAL STONE PROTOCOL
2 of 3 series · 17 of 36 positions shown, 19 images · non-contrast
Comparison: 09/10/2014

CLINICAL DATA: Left flank pain beginning yesterday. Dark urine.
Possible kidney stone.

EXAM:
CT ABDOMEN AND PELVIS WITHOUT CONTRAST
TECHNIQUE: Multidetector CT imaging of the abdomen and pelvis was performed
following the standard protocol without IV contrast.

[Series 4: lung · axial · 0.77mm/px · z∈[-148,-53]mm · 14 of 22 slices shown, 16 images]
[im 2/22  soft-tissue]
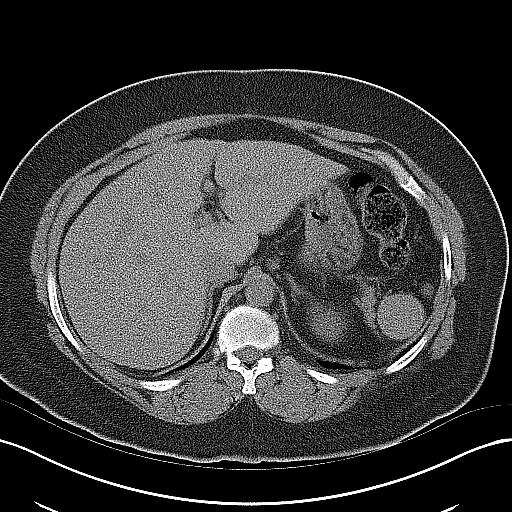
[im 2/22  bone]
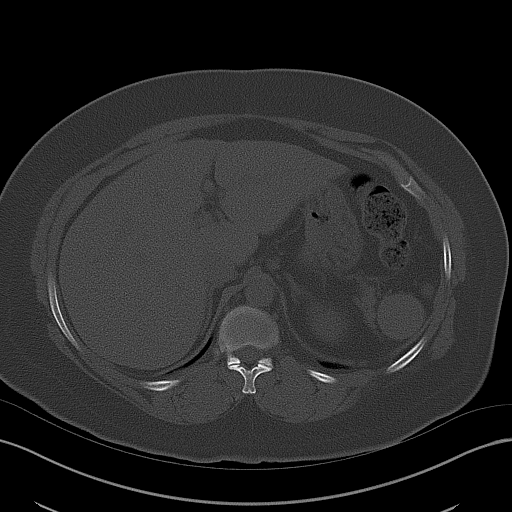
[im 4/22  soft-tissue]
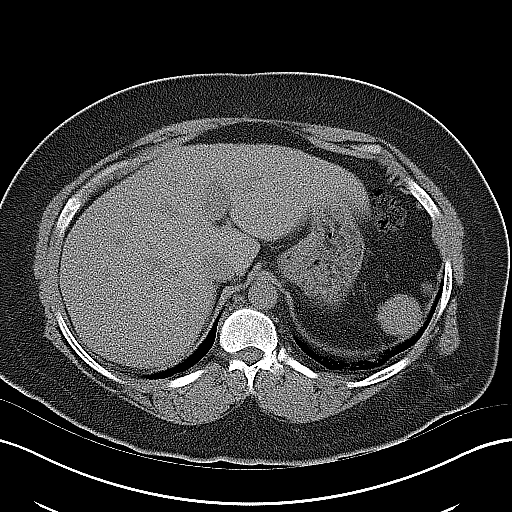
[im 5/22  soft-tissue]
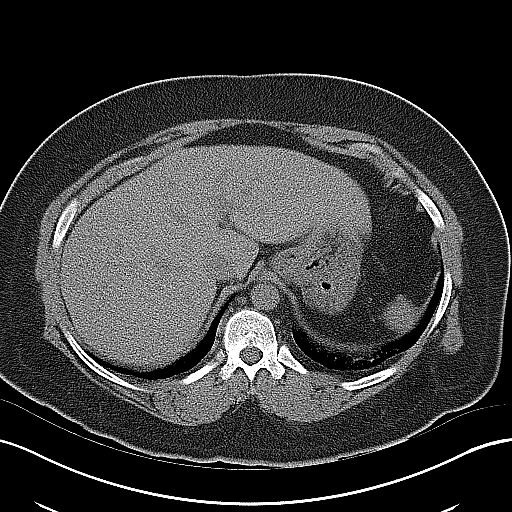
[im 7/22  soft-tissue]
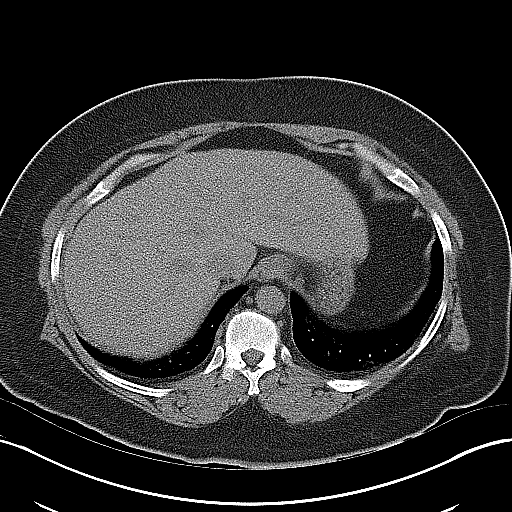
[im 8/22  soft-tissue]
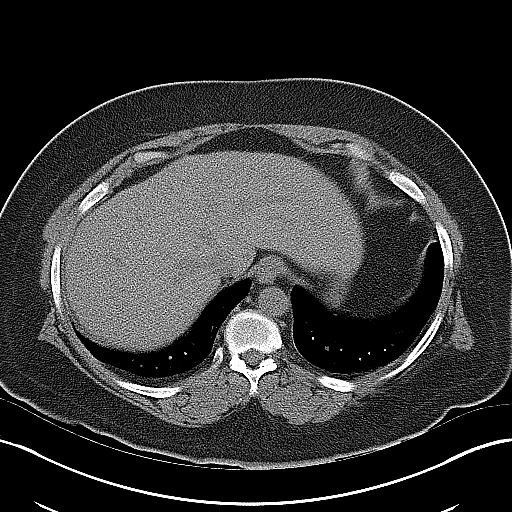
[im 9/22  soft-tissue]
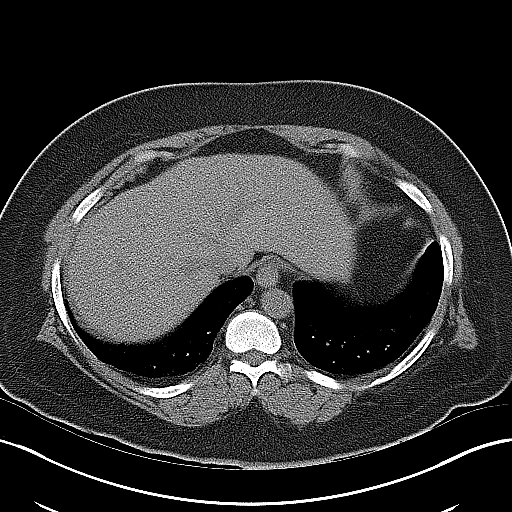
[im 11/22  soft-tissue]
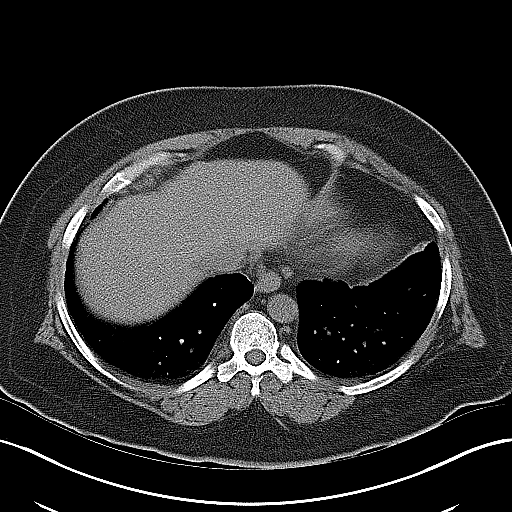
[im 12/22  soft-tissue]
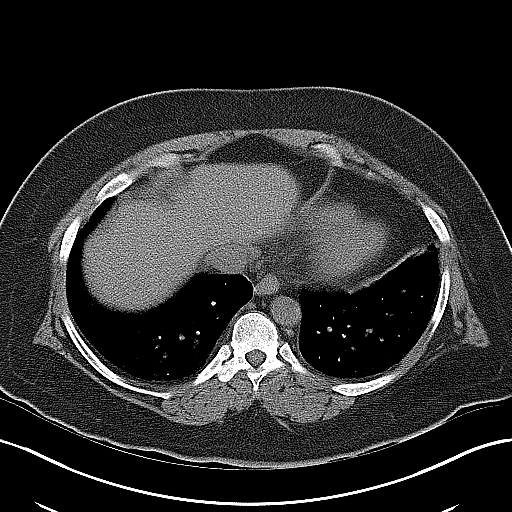
[im 14/22  soft-tissue]
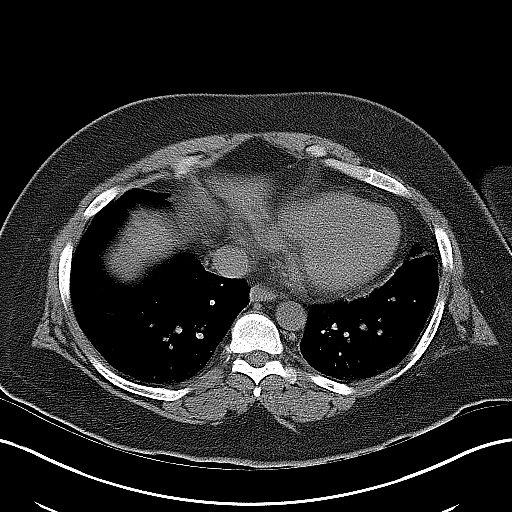
[im 14/22  bone]
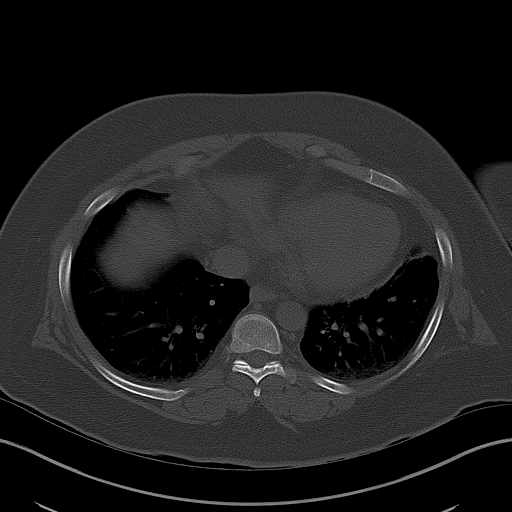
[im 15/22  soft-tissue]
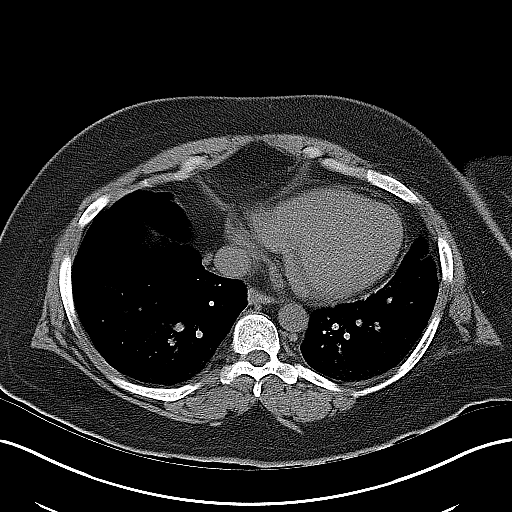
[im 17/22  soft-tissue]
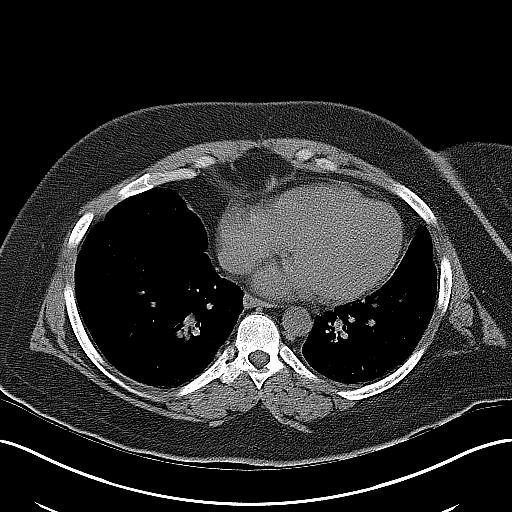
[im 18/22  soft-tissue]
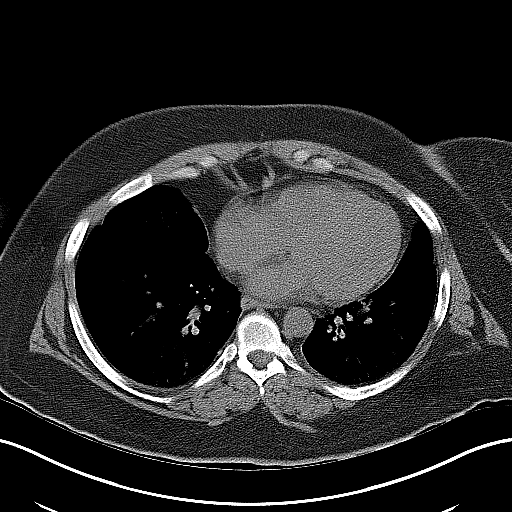
[im 19/22  soft-tissue]
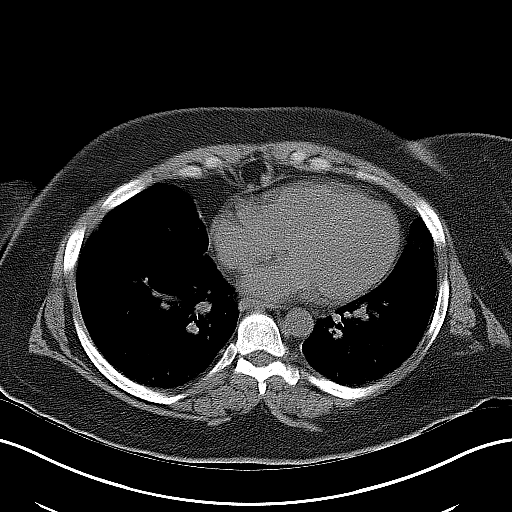
[im 21/22  soft-tissue]
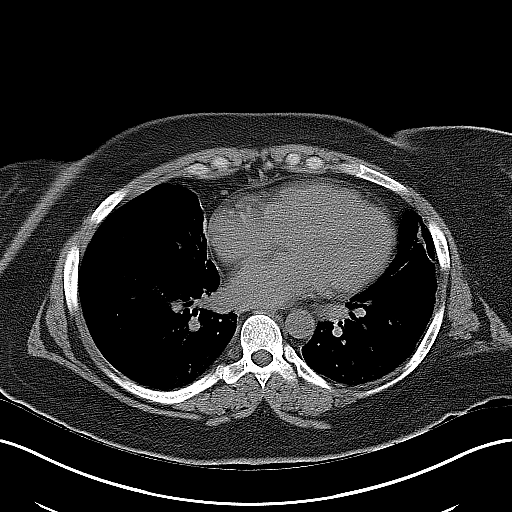

[Series 5: coronal · coronal · 0.78mm/px · 3 of 107 slices shown]
[im 36/107  soft-tissue]
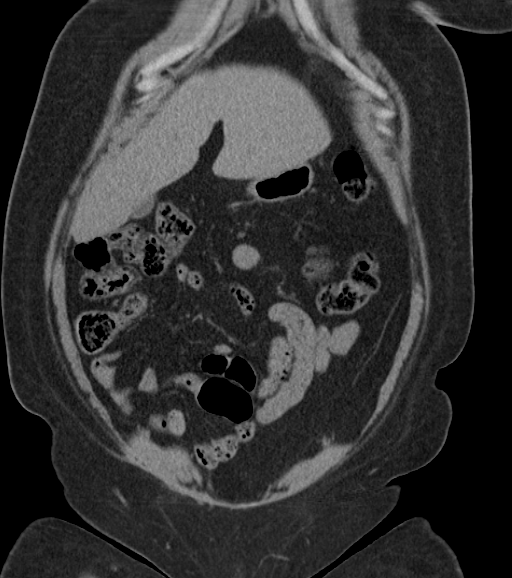
[im 48/107  soft-tissue]
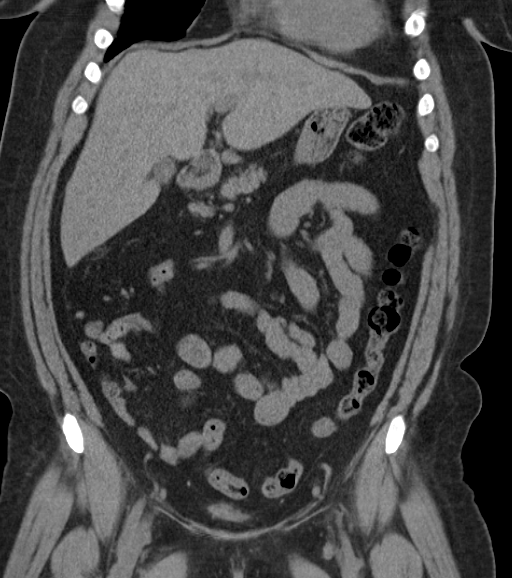
[im 59/107  soft-tissue]
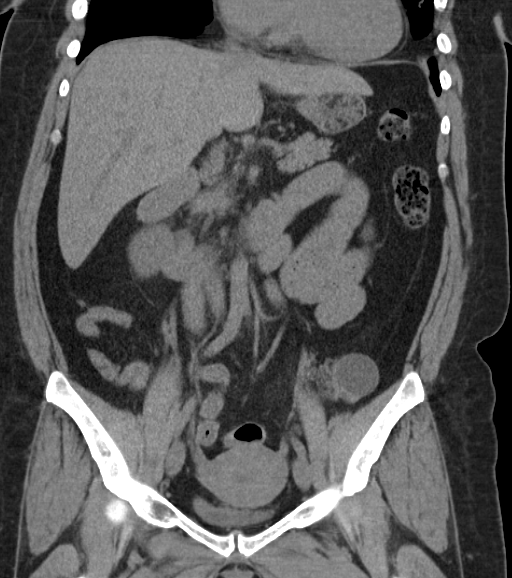

[17 of 36 positions shown; findings below may reference images not displayed]

FINDINGS: Subsegmental atelectasis is noted in both scratched it dependent
subsegmental atelectasis is noted in both lower lobes with
atelectasis versus scarring in the lingula. No pleural effusion.

The liver, gallbladder, adrenal glands, and pancreas have an
unremarkable unenhanced appearance. Sequelae of prior splenectomy
are again identified with splenosis in the left upper quadrant.
Individual splenic nodules measure up to 3.8 cm in size, unchanged.

There are 2-3 punctate nonobstructing calculi in the interpolar and
lower pole regions of the right kidney. There is mild left
hydronephrosis and proximal hydroureter due to a 3 mm calculus in
the proximal ureter. There is mild left periureteral stranding.

The small and large bowel are nondilated without evidence of
obstruction. Appendix is unremarkable. The bladder is decompressed.
Uterus and ovaries are identified. 3.2 x 2.9 cm left ovarian cyst is
unchanged in size.

No free fluid or enlarged lymph nodes are identified. A small fat
containing umbilical hernia is again noted. Sclerosis of the right
ilium along the SI joint is unchanged and may reflect osteitis
condensans ilii.
IMPRESSION: 1. Obstructing 3 mm calculus in the proximal left ureter with mild
hydronephrosis.
2. Punctate nonobstructing right nephrolithiasis.
